# Patient Record
Sex: Male | Born: 1950 | Race: White | Marital: Married | Smoking: Never smoker
Health system: Northeastern US, Academic
[De-identification: ages and names within clinical notes are randomized; demographics above are authoritative.]

## PROBLEM LIST (undated history)

## (undated) DIAGNOSIS — M1990 Unspecified osteoarthritis, unspecified site: Secondary | ICD-10-CM

## (undated) DIAGNOSIS — E785 Hyperlipidemia, unspecified: Secondary | ICD-10-CM

## (undated) DIAGNOSIS — F419 Anxiety disorder, unspecified: Secondary | ICD-10-CM

## (undated) DIAGNOSIS — I10 Essential (primary) hypertension: Secondary | ICD-10-CM

## (undated) DIAGNOSIS — K219 Gastro-esophageal reflux disease without esophagitis: Secondary | ICD-10-CM

## (undated) HISTORY — DX: Gastro-esophageal reflux disease without esophagitis: K21.9

## (undated) HISTORY — DX: Anxiety disorder, unspecified: F41.9

## (undated) HISTORY — DX: Essential (primary) hypertension: I10

## (undated) HISTORY — PX: OTHER SURGICAL HISTORY: SHX169

## (undated) HISTORY — PX: SPINAL FUSION: SHX223

## (undated) HISTORY — DX: Hyperlipidemia, unspecified: E78.5

## (undated) HISTORY — DX: Unspecified osteoarthritis, unspecified site: M19.90

## (undated) HISTORY — PX: KNEE REPLACEMENT: SHX530B

---

## 2019-12-31 IMAGING — CT CT CHEST WITHOUT CONTRAST
2 of 4 series · 13 of 36 positions shown, 16 images · non-contrast
Comparison: Calcium scoring CT images 12/24/2019.

CT CHEST WITHOUT CONTRAST, 12/31/2019 [DATE]: 
CLINICAL INDICATION: Micronodules base of the LEFT lung. 
A search for DICOM formatted images was conducted for prior CT imaging studies 
completed at a non-affiliated media free facility.
TECHNIQUE: The chest was scanned from base of neck through the lung bases 
without contrast on a high resolution low dose CT scanner.  Routine MPR and MIP 
3D renderings were reconstructed on an independent workstation with concurrent 
physician supervision.

[Series 2: chest w/o 2.0 i31s 3 · axial · non-contrast · 0.97mm/px · z∈[-287,-15]mm · 10 of 162 slices shown, 13 images]
[im 13/162  mediastinal]
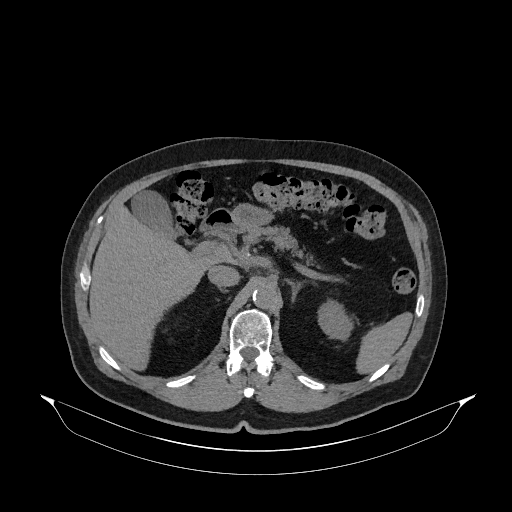
[im 13/162  lung]
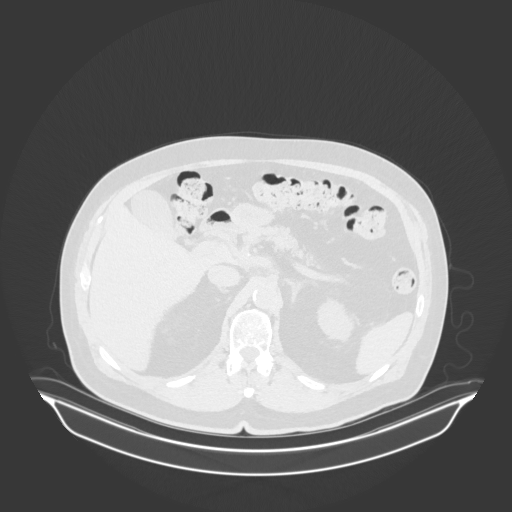
[im 25/162  lung]
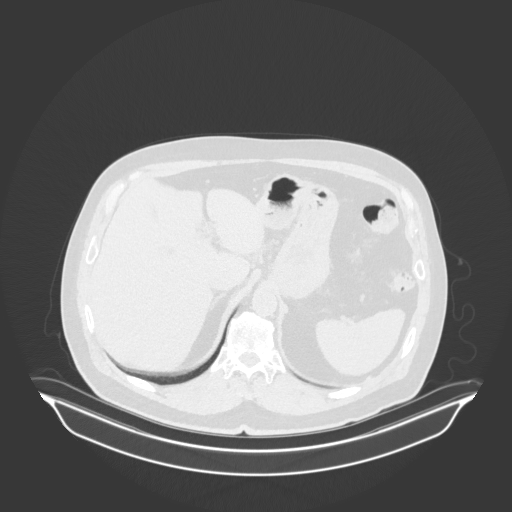
[im 50/162  lung]
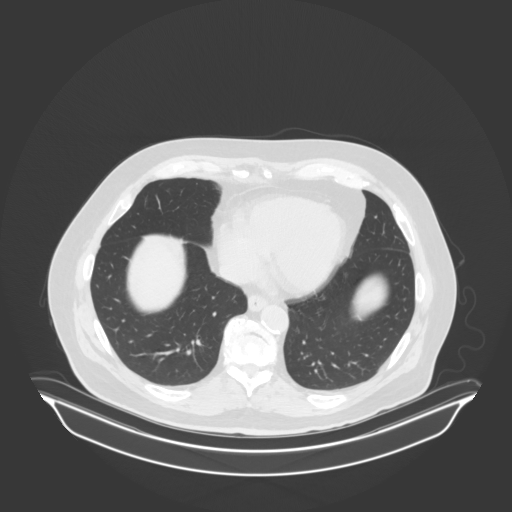
[im 62/162  lung]
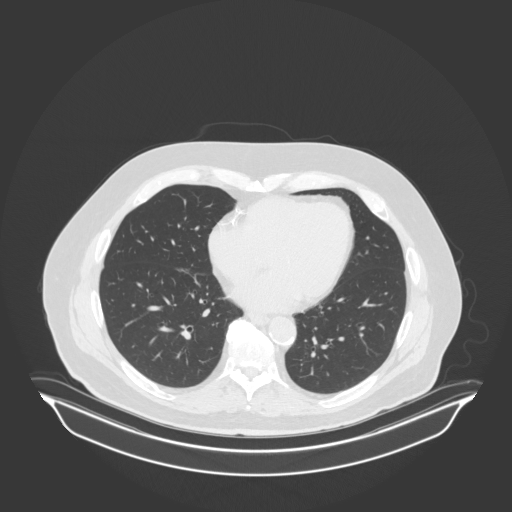
[im 75/162  mediastinal]
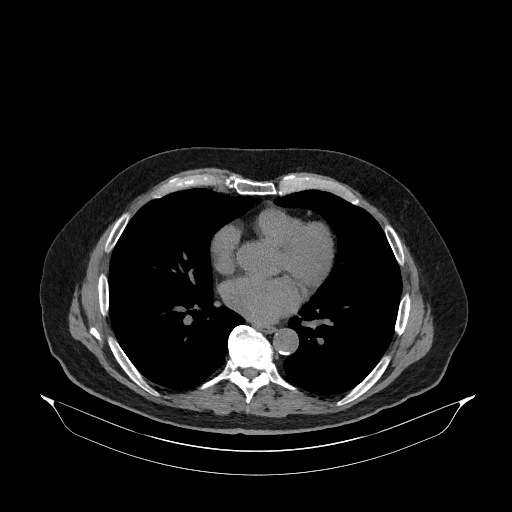
[im 75/162  lung]
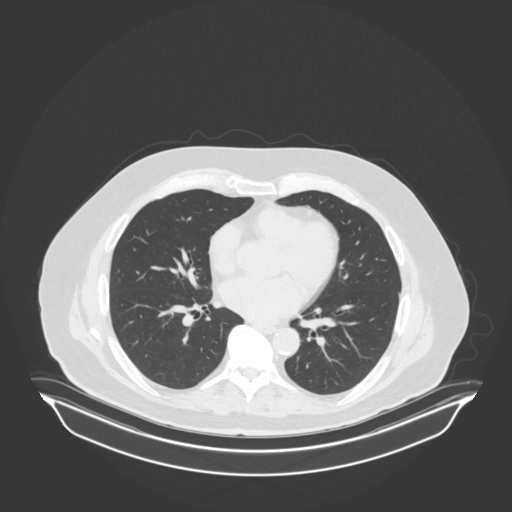
[im 87/162  lung]
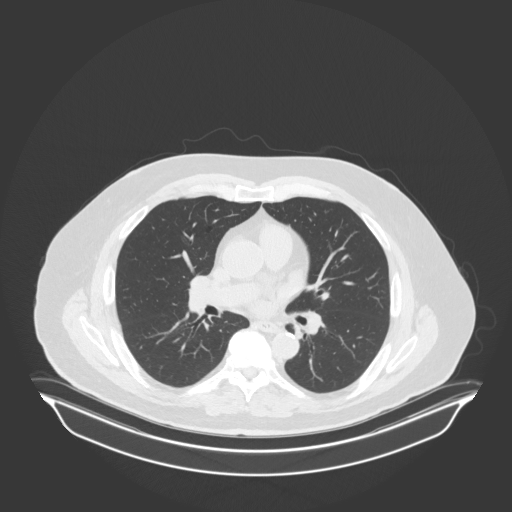
[im 100/162  lung]
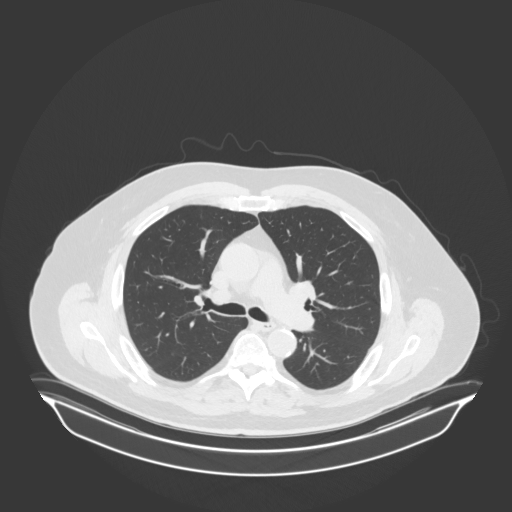
[im 124/162  lung]
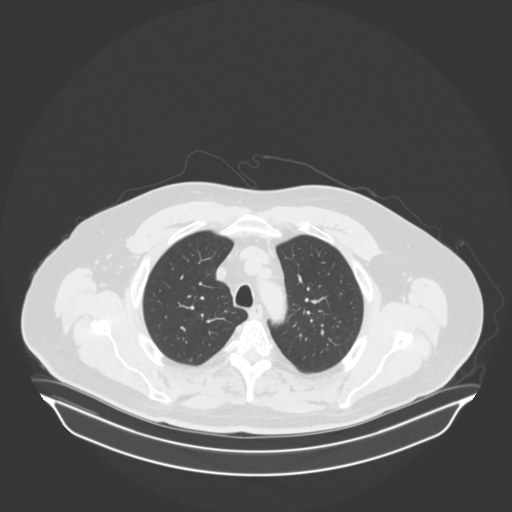
[im 137/162  mediastinal]
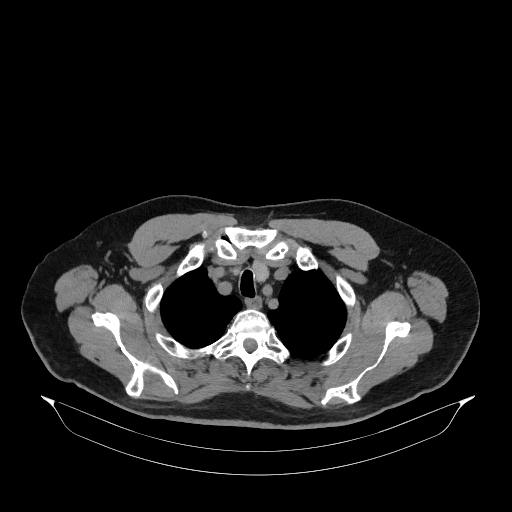
[im 137/162  lung]
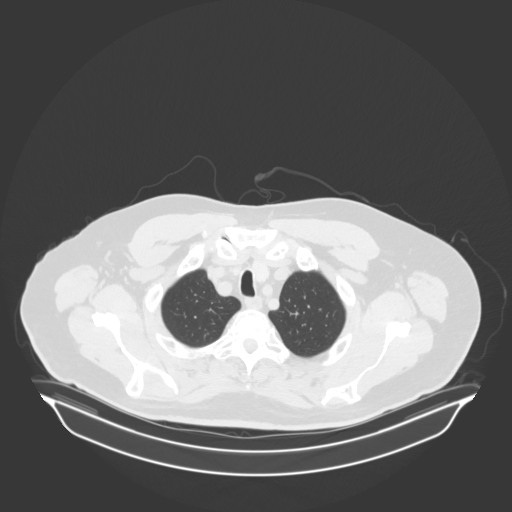
[im 149/162  lung]
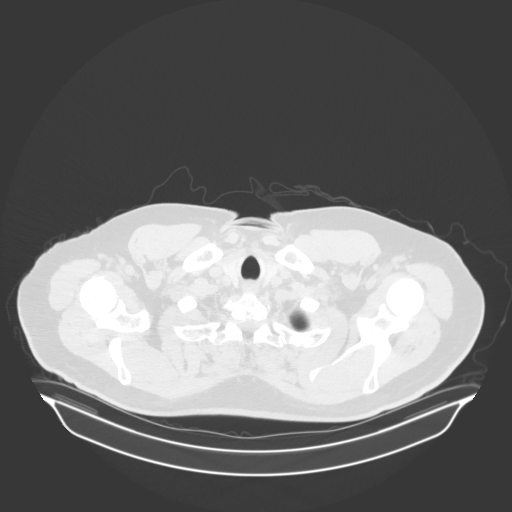

[Series 4: coronal · coronal · 0.66mm/px · 3 of 144 slices shown]
[im 29/144  lung]
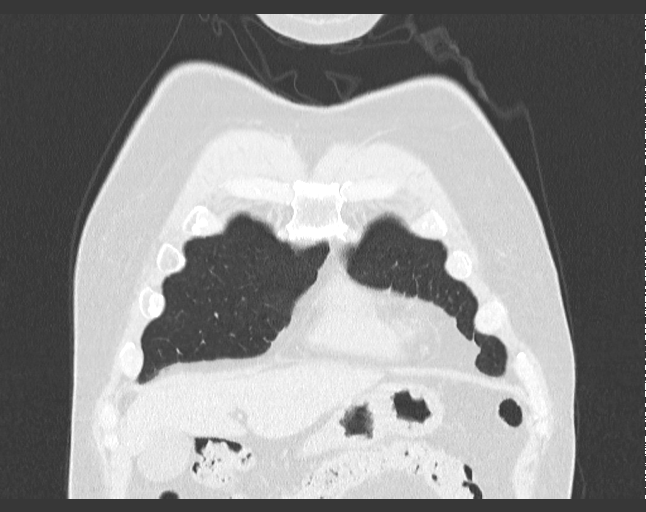
[im 58/144  lung]
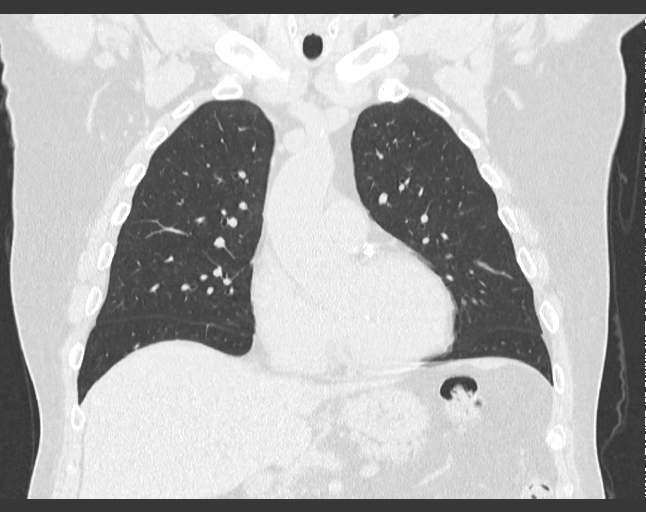
[im 86/144  lung]
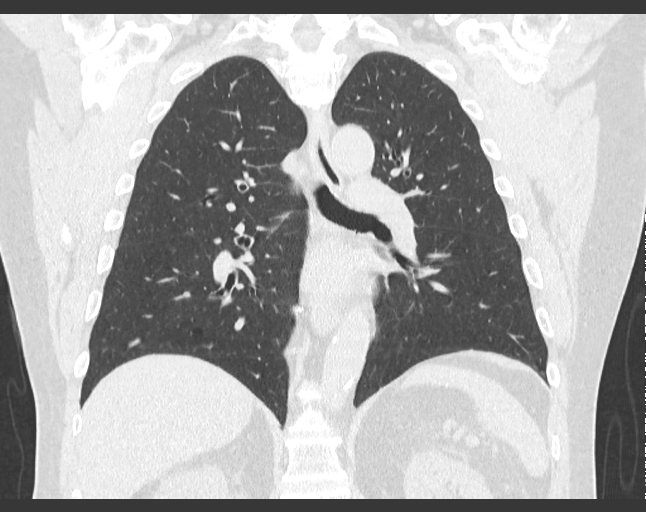

[13 of 36 positions shown; findings below may reference images not displayed]

FINDINGS: The described abnormality is an 8 mm groundglass nodule in the anterior base of 
the LEFT lower lobe which should undergo standard follow-up following Fleischner 
guidelines which are included at the end of this report. No other significant 
lung nodules or masses are found. No evidence for emphysema or interstitial lung 
disease. The airways are normal. No pleural effusion. The heart is normal in 
size. Moderate to severe coronary artery calcifications are visible. No 
significant pericardial fluid. Overall diameter of the aorta is normal. There is 
a small hiatal hernia. Esophagus is within normal limits. No enlarged 
mediastinal lymph nodes are detected. There is no axillary lymphadenopathy. 
Limited imaging of the inferior neck shows no significant incidental pathology. 
Limited imaging of the upper abdomen shows incidental small simple cyst in the 
LEFT lobe of the liver. There is a partially visible cyst in the RIGHT kidney. 
Degenerative changes are in the spine. There is a calcified disc herniation at 
T11-12 producing moderate spinal canal stenosis and possible cord compression.
IMPRESSION: 1. The LEFT lower lung abnormality in question is a groundglass nodule measuring 
approximately 8 mm which could represent a scar or a small neoplasm. Follow-up 
should be performed as per Fleischner guidelines included below. (NODULE SIZE>/= 
6 MM: 
*FOLLOW-UP CT AT 6-12 MONTHS, THEN EVERY TWO YEARS UNTIL FIVE YEARS) 
2. Calcified disc herniation at T11-12 producing moderate central canal stenosis 
and potential cord compromise. REFERENCE: Recommendations for pulmonary nodule 
follow-up according to [HOSPITAL] Guidelines. 
Reference: 
Solitary pure ground-glass nodule 
Nodule size < 6mm 
*No ct follow-up is required 
NODULE SIZE>/= 6 MM 
*FOLLOW-UP CT AT 6-12 MONTHS, THEN EVERY TWO YEARS UNTIL FIVE YEARS 
Solitary part-solid nodule 
Nodule size < 6mm 
*No ct follow-up is required 
Nodule size >/= 6mm 
*Follow-up CT at 3-6 months 
*If unchanged, and solid component remains < 6mm, then annual follow-up for 5 
years. 
Multiple nodules size: < 6mm 
*Low risk patients: no routine follow-up. 
*High risk patients: optional CT at 12 months. 
Multiple nodules size: 6-8mm 
*Low risk patients: follow-up at 3-6 months, then consider further follow-up at 
18-24 months. 
*High risk patients: follow-up at 3-6 months, then at 18-24 months if no change. 
Multiple nodules size: > 8mm 
*Low risk patients: follow-up at 3-6 months, then consider further follow-up at 
18-24 months. 
*High risk patients: follow-up at 3-6 months, then at 18-24 months if no change. 
Size is average of length and width. 
RADIATION DOSE REDUCTION: All CT scans are performed using radiation dose 
reduction techniques, when applicable.  Technical factors are evaluated and 
adjusted to ensure appropriate moderation of exposure.  Automated dose 
management technology is applied to adjust the radiation doses to minimize 
exposure while achieving diagnostic quality images.

## 2022-11-07 NOTE — Progress Notes (Signed)
Type of Form: medical records    Delivery Method: fax    Received From:[facility]    Release Obtained: yes    What to do with form after completion:give to provider to review    Where was form placed: placed in file cabinet up front as patient's appointment is not until 04/27/2023.

## 2023-02-03 ENCOUNTER — Encounter: Payer: Self-pay | Admitting: Gastroenterology

## 2023-02-08 ENCOUNTER — Encounter: Payer: Self-pay | Admitting: Gastroenterology

## 2023-03-16 ENCOUNTER — Ambulatory Visit: Payer: Medicare Other | Attending: Family Medicine | Admitting: Family Medicine

## 2023-03-16 ENCOUNTER — Encounter: Payer: Self-pay | Admitting: Family Medicine

## 2023-03-16 ENCOUNTER — Other Ambulatory Visit: Payer: Self-pay

## 2023-03-16 ENCOUNTER — Telehealth: Payer: Self-pay | Admitting: Family Medicine

## 2023-03-16 VITALS — BP 134/70 | HR 71 | Temp 97.1°F | Ht 66.0 in | Wt 220.0 lb

## 2023-03-16 DIAGNOSIS — I10 Essential (primary) hypertension: Secondary | ICD-10-CM | POA: Insufficient documentation

## 2023-03-16 DIAGNOSIS — E785 Hyperlipidemia, unspecified: Secondary | ICD-10-CM | POA: Insufficient documentation

## 2023-03-16 DIAGNOSIS — E119 Type 2 diabetes mellitus without complications: Secondary | ICD-10-CM

## 2023-03-16 DIAGNOSIS — F411 Generalized anxiety disorder: Secondary | ICD-10-CM | POA: Insufficient documentation

## 2023-03-16 NOTE — Telephone Encounter (Signed)
Spoke to patient. Stated that he just received a call from Cardiology and he is scheduled with them on May 8th. He will continue with his appointment with Dr. Darnelle Bos on May 2nd.

## 2023-03-16 NOTE — Telephone Encounter (Signed)
Noted  

## 2023-03-16 NOTE — Progress Notes (Signed)
Subjective: Joe Roman is a 72 year old male with history of hyperlipidemia, type 2 diabetes, anxiety, and hypertension who presents today for a new patient visit.  He has an appointment scheduled with Dr. Darnelle Bos 04/27/2023.  Joe Roman is originally from [place] and spends the winters in [place].  Over this winter they built a house in [place] and moved in a few weeks ago to be near his family and grandchildren.    Hyperlipidemia: Joe Roman was previously started on Lipitor a long time ago.  He had tolerated it well until age 52 then developed myalgias.  This was stopped and he was started on Crestor and developed the same side effects including myalgias of the arms and legs.  He saw cardiology who put him on another cholesterol medicine that he cannot remember the name and also had side effects by 6 months.  He then started Repatha in July and tolerated this well for the first few months.  He then developed worsening lower back, knee, and joint pain 2 days after the injection.  This would feel quite debilitating for a week then his symptoms will start to improve.  He was not experiencing side effects with the first few injections.  He has had a bilateral knee replacement and when he has increased pain in his knees after the injection this changes his gait and he is concerned he will fall due to the poor balance.  His last injection was 02/25/2023.  He is interested in different medication for his cholesterol.  He had blood work in [place] done 02/03/2023 that showed improving cholesterol panel.  He is afraid to go off the medication.    Hypertension: Joe Roman checks his blood pressure at home and it typically is around 126/68.  Denies chest pain, shortness of breath, or lower extremity edema.    Hyperglycemia: Joe Roman tells me his blood sugars have been increasing since being on the Repatha.  He was prediabetic but at the February blood draw his hemoglobin A1c was 6.6.  He has cut out ice cream, pizza, and [place] fries.  He no longer  drinks craft beers.  His doctor in [place] said he could have until November to get his numbers down and they would recheck this.  He was not started on medication for diabetes.  There is a family history of type 2 diabetes.  Joe Roman has not been able to walk much due to the ongoing joint pain.  He plans to join the [employer] in [place] and start swimming again.  His joints and lower back feels much better when he is swimming.    Anxiety: Joe Roman had been on Lexapro 10 mg daily when his mother was sick and dying.  There was a lot of family stressors at that time.  He was prescribed alprazolam to take as needed.  He takes this very sparingly.  He typically only takes it every few months if he is flying or has something stressful going on, for example the closing of his house recently.    Medication list is reviewed and updated in Epic. Medication changes are made.     Past medical history including medications and allergies reviewed in Epic, no changes.     Current Outpatient Medications on File Prior to Visit   Medication Sig Dispense Refill    ALPRAZolam (XANAX) 0.25 mg tablet Take 1 tablet (0.25 mg total) by mouth 2 times daily as needed for Anxiety  Max daily dose: 0.5 mg      evolocumab (REPATHA) 140 mg/mL prefilled  syringe Inject 1 mL (140 mg total) into the skin every 14 days      amLODIPine (NORVASC) 5 mg tablet Take 1 tablet (5 mg total) by mouth daily      metoprolol tartrate (LOPRESSOR) 25 mg tablet Take 0.5 tablets (12.5 mg total) by mouth 2 times daily      aspirin 81 mg EC tablet Take 1 tablet (81 mg total) by mouth daily      Probiotic Product (PROBIOTIC DAILY PO) Take 1 tablet by mouth daily      Multiple Vitamins-Minerals (MULTIVITAMIN ADULTS 50+ PO) Take 1 tablet by mouth daily      cyanocobalamin (VITAMIN B-12) 500 mcg tablet Take 1 tablet (500 mcg total) by mouth daily       No current facility-administered medications on file prior to visit.         There is no problem list on file for this patient.        Objective:   BP 134/70   Pulse 71   Temp 36.2 C (97.1 F) (Temporal)   Ht 1.676 m (5\' 6" )   Wt 99.8 kg (220 lb)   SpO2 98%   BMI 35.51 kg/m    General: Well developed, well nourished, no acute distress.  Cardiovascular: Regular rate and rhythm, no murmurs, no rubs, normal S1 and S2, no carotid bruits.  Respiratory: Lungs clear to auscultation, no wheezes, no rhonchi.  Extremities: 2+ pedal pulses, no edema, capillary refill under 3 seconds.    Labs done 02/03/2023 through [facility].  Cholesterol 157  Triglycerides 128  HDL 51  LDL 80  Hemoglobin A1c 6.6    Assessment/Plan:   1. Hyperlipidemia, unspecified hyperlipidemia type  AMB REFERRAL TO [facility]ARDIOLOGY - [place]      2. Hypertension, unspecified type        3. Generalized anxiety disorder        4. Type 2 diabetes mellitus           1.  Hyperlipidemia: Referral placed for cardiology for further management of the hyperlipidemia and options for newer injectable medications.  I will also touch base with Dr. Darnelle Bos to see if there are medications she prefers.  He will follow-up in the office today as planned.    2.  Hypertension: Blood pressure at goal.  Continue current medications.    3.  Generalized anxiety disorder: Continue Xanax as needed for flying.    4.  Type 2 diabetes: He will continue increasing his physical activity and reducing carbohydrates and sweets.    Addendum: I spoke with Dr. Darnelle Bos and she does not typically prescribe injectably medications for hyperlipidemia and agrees with cardiology consult. I will notify Joe Roman.     Also see patient instructions.    After visit summary is printed and given to patient.    This note was dictated wholly or in part with Dragon voice recognition software.

## 2023-03-16 NOTE — Telephone Encounter (Signed)
Please let [name] know I spoke with Dr. Darnelle Bos and she does not typically prescribe injectable medications for hyperlipidemia.  She agrees with a cardiology consult for further management.

## 2023-03-16 NOTE — Progress Notes (Signed)
Release of Medical Records faxed to Dr.[facility] at [phone].    Fax-confirmed

## 2023-04-27 ENCOUNTER — Other Ambulatory Visit: Payer: Self-pay

## 2023-04-27 ENCOUNTER — Encounter: Payer: Self-pay | Admitting: Family Medicine

## 2023-04-27 ENCOUNTER — Ambulatory Visit: Payer: Medicare Other | Attending: Family Medicine | Admitting: Family Medicine

## 2023-04-27 VITALS — BP 130/60 | HR 63 | Temp 97.1°F | Ht 66.0 in | Wt 213.3 lb

## 2023-04-27 DIAGNOSIS — F40243 Fear of flying: Secondary | ICD-10-CM | POA: Insufficient documentation

## 2023-04-27 DIAGNOSIS — G8929 Other chronic pain: Secondary | ICD-10-CM

## 2023-04-27 DIAGNOSIS — M545 Low back pain, unspecified: Secondary | ICD-10-CM | POA: Insufficient documentation

## 2023-04-27 DIAGNOSIS — I10 Essential (primary) hypertension: Secondary | ICD-10-CM | POA: Insufficient documentation

## 2023-04-27 MED ORDER — AMLODIPINE BESYLATE 10 MG PO TABS *I*
10.0000 mg | ORAL_TABLET | Freq: Every day | ORAL | 1 refills | Status: DC
Start: 2023-04-27 — End: 2023-10-30

## 2023-04-27 NOTE — Progress Notes (Signed)
NAMEBobbyjoe Roman MRN: [id number]    DOB: [birth year] AGE: 72 y.o. SEX: male     Chief Complaint: Chief Complaint   Patient presents with    New Patient Visit    Back Pain     D/x'd with spinal stenosis, ruptured disc, has had steroid injection with minimal relief.        Visit History for  04/27/2023    Data   History of Present Illness    Np here to establish care, was seen by Fleet Contras before    Was seen by other PCP in [place], last seen in February. 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  Reluctant to start any new meds  Has an appt with Pain doc in the next few month  He is managing fine at this time.  He exercises a lot, can't do 3 miles anymore  Swims a lot, joined the [employer] [facility] cardio and goes 2-3x a week  Also does Yoga 2x a week and pilates a few times a week.    Hypertension  Currently on amlodipine 10mg   No compliance concerns. Tolerating medications well, denies medication side effects  Denies chest pain, SOB, blurry vision, LE edema, headaches.   Follows low salt diet.  Attempts to follow heart healthy diet and regular exercise.   Chonic condition    Anxiety: associated with flying and takes periodically for flying     No results found for: "LDLC", "TSH", "GLU", "CA", "NA", "K", "CREAT", "AST"    No results found for: "HCT"     BP Readings from Last 3 Encounters:   04/27/23 130/60   03/16/23 134/70       Body mass index is 34.43 kg/m.    Wt Readings from Last 3 Encounters:   04/27/23 96.8 kg (213 lb 4.8 oz)   03/16/23 99.8 kg (220 lb)      Review of Systems   As hpi        Tobacco  History:  Patient  reports that he has never smoked. He has never been exposed to tobacco smoke. He has never used  smokeless tobacco.               Family History   Problem Relation Age of Onset    High Blood Pressure Mother     Arthritis Mother     Anxiety disorder Mother     High Blood Pressure Father     Heart Disease Father     Diabetes Father     Cancer Father         color    Heart attack Father     High cholesterol Brother     Stroke Brother     High cholesterol Brother     High Blood Pressure Maternal Grandmother     Diabetes Maternal Grandmother     Stroke Maternal Grandfather     High Blood Pressure Maternal Grandfather      Social History     Tobacco Use    Smoking status: Never     Passive exposure: Never    Smokeless tobacco: Never   Vaping  Use    Vaping status: Never Used   Substance Use Topics    Alcohol use: Yes     Comment: 1-2 per week    Drug use: Never       Medications and Allergies     Current Outpatient Medications on File Prior to Visit   Medication Sig Dispense Refill    magnesium oxide (MAG-OX) 400 (241.3 mg) mg tablet Take 0.5 tablets (200 mg total) by mouth daily.      metoprolol tartrate (LOPRESSOR) 25 mg tablet Take 0.5 tablets (12.5 mg total) by mouth 2 times daily.      aspirin 81 mg EC tablet Take 1 tablet (81 mg total) by mouth daily.      Probiotic Product (PROBIOTIC DAILY PO) Take 1 tablet by mouth daily      Multiple Vitamins-Minerals (MULTIVITAMIN ADULTS 50+ PO) Take 1 tablet by mouth daily      cyanocobalamin (VITAMIN B-12) 500 mcg tablet Take 1 tablet (500 mcg total) by mouth daily.      ALPRAZolam (XANAX) 0.25 mg tablet Take 1 tablet (0.25 mg total) by mouth 2 times daily as needed for Anxiety.       No current facility-administered medications on file prior to visit.     No Known Allergies (drug, envir, food or latex)    I have reviewed the above medications and allergies         Objective:     Today's vitals:  height is 1.676 m (5\' 6" ) and weight is 96.8 kg (213 lb 4.8 oz). His temporal temperature is 36.2 C (97.1 F). His blood pressure is 130/60 and his pulse is 63. His oxygen  saturation is 98%.      Physical Exam  Constitutional:       Appearance: Normal appearance. He is obese.   Pulmonary:      Effort: Pulmonary effort is normal.   Neurological:      Mental Status: He is alert. Mental status is at baseline.   Psychiatric:         Mood and Affect: Mood normal.         Behavior: Behavior normal.         Thought Content: Thought content normal.         Judgment: Judgment normal.               Assessment/Plan       [name] was seen today for new patient visit and back pain.    Diagnoses and all orders for this visit:    Hypertension, unspecified type  Well controlled at this time, cont with amlodipine  Cont with exercise and good diet and weight loss  -     amLODIPine (NORVASC) 10 mg tablet; Take 1 tablet (10 mg total) by mouth daily for High Blood Pressure Disorder.  -     Comprehensive metabolic panel; Future  -     Lipid Panel (Reflex to Direct  LDL if Triglycerides more than 400); Future    Chronic bilateral low back pain without sciatica  Stable with exercise, sees pain management in [place] as well    Anxiety with flying  Alprazolam PRN with flying                           Follow up  recommended: Follow up in about 6 months (around 10/28/2023) for Hypertension.     Patient verbalized understanding of the above instructions.  Dr.Angelik Walls MD.  [place]

## 2023-04-28 ENCOUNTER — Telehealth: Payer: Self-pay | Admitting: Family Medicine

## 2023-04-28 ENCOUNTER — Other Ambulatory Visit
Admission: RE | Admit: 2023-04-28 | Discharge: 2023-04-28 | Disposition: A | Discharge status: Home / Self Care | Payer: Medicare Other | Source: Ambulatory Visit | Attending: Family Medicine | Admitting: Family Medicine

## 2023-04-28 DIAGNOSIS — I10 Essential (primary) hypertension: Secondary | ICD-10-CM

## 2023-04-28 LAB — COMPREHENSIVE METABOLIC PANEL
ALT: 27 U/L (ref 0–50)
AST: 26 U/L (ref 0–50)
Albumin: 4.1 g/dL (ref 3.5–5.2)
Alk Phos: 70 U/L (ref 40–130)
Anion Gap: 11 (ref 7–16)
Bilirubin,Total: 0.6 mg/dL (ref 0.0–1.2)
CO2: 24 mmol/L (ref 20–28)
Calcium: 9.6 mg/dL (ref 8.6–10.2)
Chloride: 106 mmol/L (ref 96–108)
Creatinine: 0.93 mg/dL (ref 0.67–1.17)
Glucose: 129 mg/dL — ABNORMAL HIGH (ref 60–99)
Lab: 18 mg/dL (ref 6–20)
Potassium: 4.7 mmol/L (ref 3.3–5.1)
Sodium: 141 mmol/L (ref 133–145)
Total Protein: 6.5 g/dL (ref 6.3–7.7)
eGFR BY CREAT: 87 *

## 2023-04-28 LAB — LIPID PANEL
Chol/HDL Ratio: 4.7
Cholesterol: 230 mg/dL — AB
HDL: 49 mg/dL (ref 40–60)
LDL Calculated: 156 mg/dL — AB
Non HDL Cholesterol: 181 mg/dL
Triglycerides: 127 mg/dL

## 2023-04-28 NOTE — Telephone Encounter (Addendum)
LM with information below and asked that call back.    ----- Message from Nurse Jomarie Longs D sent at 04/28/2023  1:22 PM EDT -----  Please let patient know labs are ok. Cholesterol is high and his risk is high for athlerosclerotis cardiovascular disease. He would need to be placed on a statin (cholesterol lowing med) to help bring bad cholesterol levels down and lower risk. Let me know if he is interested and I will send him something.

## 2023-05-03 ENCOUNTER — Other Ambulatory Visit: Payer: Self-pay

## 2023-05-03 ENCOUNTER — Encounter: Payer: Self-pay | Admitting: Cardiology

## 2023-05-03 ENCOUNTER — Ambulatory Visit: Payer: Medicare Other | Attending: Cardiology | Admitting: Cardiology

## 2023-05-03 VITALS — BP 140/58 | HR 63 | Ht 66.0 in | Wt 216.6 lb

## 2023-05-03 DIAGNOSIS — I213 ST elevation (STEMI) myocardial infarction of unspecified site: Secondary | ICD-10-CM

## 2023-05-03 DIAGNOSIS — Z789 Other specified health status: Secondary | ICD-10-CM

## 2023-05-03 DIAGNOSIS — I440 Atrioventricular block, first degree: Secondary | ICD-10-CM

## 2023-05-03 DIAGNOSIS — R002 Palpitations: Secondary | ICD-10-CM | POA: Insufficient documentation

## 2023-05-04 ENCOUNTER — Encounter: Payer: Self-pay | Admitting: Cardiology

## 2023-05-04 NOTE — Progress Notes (Signed)
[facility]        Cardiology Office Consult Note    Date of Consult: 05/03/2023 Patient: Joe Roman   Patients PCP: Erlene Senters, MD Patient DOB: [birth year]  EMRN: [id number]     History of Present Illness/Reason For Visit     I had the pleasure of seeing Joe Roman in cardiology consult on 05/03/2023. Joe Roman is an 72 y.o. male who we were asked to see for hyperlipidemia.    Joe Roman in [place]; followed there with previous cardiac testing, not yet available; has hyperlipidemia and has been on multiple statins, Zetia, Repatha; in each case has had initial good lab value response but after several months muscle and joint pain which resolves with going off the Rx.    Today appears comfortable.    Past Medical and Surgical History     Past Medical History:   Diagnosis Date   . Anxiety    . GERD (gastroesophageal reflux disease)    . Hyperlipemia    . Hypertension      Past Surgical History:   Procedure Laterality Date   . KNEE REPLACEMENT Bilateral    . left hip replacement Left        Medications and Allergies     Current Outpatient Medications   Medication Sig   . magnesium oxide (MAG-OX) 400 (241.3 mg) mg tablet Take 0.5 tablets (200 mg total) by mouth daily.   Marland Kitchen amLODIPine (NORVASC) 10 mg tablet Take 1 tablet (10 mg total) by mouth daily for High Blood Pressure Disorder.   . metoprolol tartrate (LOPRESSOR) 25 mg tablet Take 0.5 tablets (12.5 mg total) by mouth 2 times daily.   Marland Kitchen aspirin 81 mg EC tablet Take 1 tablet (81 mg total) by mouth daily.   . Probiotic Product (PROBIOTIC DAILY PO) Take 1 tablet by mouth daily   . Multiple Vitamins-Minerals (MULTIVITAMIN ADULTS 50+ PO) Take 1 tablet by mouth daily   . cyanocobalamin (VITAMIN B-12) 500 mcg tablet Take 1 tablet (500 mcg total) by mouth daily.   Marland Kitchen ALPRAZolam (XANAX) 0.25 mg tablet Take 1 tablet (0.25 mg total) by mouth 2 times daily as needed for Anxiety.     He has No Known Allergies (drug, envir, food or latex).    Social and Family History      Family History   Problem Relation Age of Onset   . High Blood Pressure Mother    . Arthritis Mother    . Anxiety disorder Mother    . High Blood Pressure Father    . Heart Disease Father    . Diabetes Father    . Cancer Father         color   . Heart attack Father    . High cholesterol Brother    . Stroke Brother    . High cholesterol Brother    . High Blood Pressure Maternal Grandmother    . Diabetes Maternal Grandmother    . Stroke Maternal Grandfather    . High Blood Pressure Maternal Grandfather      Social History     Socioeconomic History   . Marital status: Married   Tobacco Use   . Smoking status: Never     Passive exposure: Never   . Smokeless tobacco: Never   Vaping Use   . Vaping status: Never Used   Substance and Sexual Activity   . Alcohol use: Yes     Comment: 1-2 per week   . Drug use: Never   .  Sexual activity: Yes     Partners: Female     Birth control/protection: None         Review of Systems     ROS  Vitals and Physical Exam     Gurnie's  height is 1.676 m (5\' 6" ) and weight is 98.2 kg (216 lb 9.6 oz). His blood pressure is 140/58 and his pulse is 63. His oxygen saturation is 96%.  Body mass index is 34.96 kg/m.    Physical Exam  Constitutional:       General: He is not in acute distress.     Appearance: He is well-developed.   HENT:      Head: Normocephalic and atraumatic.   Neck:      Vascular: No JVD.   Cardiovascular:      Rate and Rhythm: Normal rate and regular rhythm.      Heart sounds: Normal heart sounds. No murmur heard.     No friction rub. No gallop.   Pulmonary:      Effort: Pulmonary effort is normal. No respiratory distress.      Breath sounds: Normal breath sounds. No wheezing or rales.   Skin:     General: Skin is warm.   Neurological:      Mental Status: He is alert.       Laboratory Data     Hematology:   No results found for requested labs within last 730 days.     Chemistry:   Results in Past 730 Days  Result Component Current Result Previous Result   Sodium 141 (04/28/2023)  Not in Time Range   Potassium 4.7 (04/28/2023) Not in Time Range   Creatinine 0.93 (04/28/2023) Not in Time Range   Glucose 129 (H) (04/28/2023) Not in Time Range   Calcium 9.6 (04/28/2023) Not in Time Range   AST 26 (04/28/2023) Not in Time Range   ALT 27 (04/28/2023) Not in Time Range     Coagulation Studies:   No results found for requested labs within last 730 days.     Cardiac:   No results found for requested labs within last 730 days.     Lipids:   Results in Past 730 Days  Result Component Current Result Previous Result   Cholesterol 230 (!) (04/28/2023) Not in Time Range   HDL 49 (04/28/2023) Not in Time Range   Triglycerides 127 (04/28/2023) Not in Time Range   LDL Calculated 156 (!) (04/28/2023) Not in Time Range   Chol/HDL Ratio 4.7 (04/28/2023) Not in Time Range       Cardiac/Imaging Data & Risk Scores     ECG: NSR, 1st degree AV block, LAD                       Impression and Plan     Patient Active Problem List   Diagnosis Code   . Chronic bilateral low back pain without sciatica M54.50, G89.29   . Hypertension, unspecified type I10   . Anxiety with flying F40.243       This is an 72 y.o. male with hyperlipidemia and medication intolerance; will obtain old records and review; will consult with specialty pharmacy re approval of Praluent or bempedoic acid.    See back in followup. Plan explained and accepted.          Geralyn Corwin, MD  Electronically signed on 05/04/2023 at 7:37 PM.

## 2023-05-05 ENCOUNTER — Other Ambulatory Visit: Payer: Self-pay | Admitting: Cardiology

## 2023-05-05 ENCOUNTER — Other Ambulatory Visit: Payer: Self-pay

## 2023-05-05 ENCOUNTER — Other Ambulatory Visit: Payer: Self-pay | Admitting: Pharmacist

## 2023-05-05 DIAGNOSIS — E785 Hyperlipidemia, unspecified: Secondary | ICD-10-CM

## 2023-05-05 MED ORDER — ALIROCUMAB 75 MG/ML SC SOAJ *I*
SUBCUTANEOUS | 5 refills | Status: DC
Start: 2023-05-05 — End: 2023-06-23
  Filled 2023-05-05: qty 2, 28d supply, fill #0
  Filled 2023-05-26: qty 6, 84d supply, fill #1
  Filled 2023-05-29: qty 2, 28d supply, fill #1

## 2023-05-05 NOTE — Progress Notes (Signed)
Received new Praluent prescription for Joe Roman.  Prior authorization is required.     Medication name, strength, instructions: Praluent 75 mg, 1 mL subcutaneously every 14 days  Quantity/days supply: 2 mL / 28 days  Prescriber: Fredric Mare    New medication or reauthorization: New  Diagnosis: ASCVD    Previous medications tried and outcome: statin/ezetimibe/Repatha intolerance    Clinical documentation:   Office note dated 05/03/23  Lipid panel dated 04/28/23  Outside records dated 02/08/23 (media tab)    Insurance plan: Maryjean Ka: [id number]  PCN: [id number]  ID: [id number]      Thank you,    Cindra Presume, PharmD, BCPS  [facility]  Phone: [phone]  Fax: [phone]

## 2023-05-08 ENCOUNTER — Other Ambulatory Visit: Payer: Self-pay

## 2023-05-08 NOTE — Progress Notes (Signed)
Prior authorization for Praluent for Joe Roman was approved through 12/26/23.  Called and left voicemail for patient.    Thank you,    Cindra Presume, PharmD, BCPS  [facility]  Phone: [phone]  Fax: [phone]

## 2023-05-09 ENCOUNTER — Other Ambulatory Visit: Payer: Self-pay

## 2023-05-09 NOTE — Addendum Note (Signed)
Addended by: Cindra Presume on: 05/09/2023 04:14 PM     Modules accepted: Orders

## 2023-05-09 NOTE — Progress Notes (Signed)
Spoke over the phone with Marlene Bast regarding medication teaching for Praluent for High CV risk, ASCVD on imaging.  Prior authorization was approved through 12/26/23 .     Initial assessment included review of interdisciplinary team documentation on physical health, social, environmental, economic, functional and mental components relevant to the patient's ability to adhere to the care plan with [facility] [employer].    Baseline lipid panel reviewed:      Lab results: 04/28/23  0828   Cholesterol 230*   Triglycerides 127   HDL 49   LDL Calculated 156*   Non HDL Cholesterol 181   Chol/HDL Ratio 4.7     Current antihyperlipidemic treatment: none (statin/ezetimibe/Repatha intolerance)    Administration, treatment goals, duration of therapy, adherence strategies, side effects, safety precautions, contraindications, missed dose management, handling, storage and disposal were reviewed with the patient.     Administration instructions: Praluent 75 mg pen into the skin every 14 days    First injection: Planned for 05/11/23. Offered phone teach appointment, but patient elects to start independently.    Adherence plan: Calendar    Lab schedule: Lipid panel ~8 weeks after medication start to assess efficacy then every 6-12 months and as needed thereafter    Specialty Pharmacy: [facility]; Delivery Date: 05/11/23; Copay: $50 (Does not qualify for [employer] grant)    Patient declined full medication reconciliation but states MyChart list is up to date. No significant interactions identified.    Current Outpatient Medications   Medication Sig    alirocumab (PRALUENT) 75 MG/ML pen injector Inject 1 pen (75 mg) into the skin every 14 days    magnesium oxide (MAG-OX) 400 (241.3 mg) mg tablet Take 0.5 tablets (200 mg total) by mouth daily.    amLODIPine (NORVASC) 10 mg tablet Take 1 tablet (10 mg total) by mouth daily for High Blood Pressure Disorder.    ALPRAZolam (XANAX) 0.25 mg tablet Take 1 tablet (0.25 mg  total) by mouth 2 times daily as needed for Anxiety.    metoprolol tartrate (LOPRESSOR) 25 mg tablet Take 0.5 tablets (12.5 mg total) by mouth 2 times daily.    aspirin 81 mg EC tablet Take 1 tablet (81 mg total) by mouth daily.    Probiotic Product (PROBIOTIC DAILY PO) Take 1 tablet by mouth daily    Multiple Vitamins-Minerals (MULTIVITAMIN ADULTS 50+ PO) Take 1 tablet by mouth daily    cyanocobalamin (VITAMIN B-12) 500 mcg tablet Take 1 tablet (500 mcg total) by mouth daily.       Plan to follow up with patient during first refill in 3 weeks. Encouraged him to call with any questions or concerns. Thank you for allowing me to participate in the care of this patient. Please contact me with any questions.    Cindra Presume, PharmD, BCPS  [facility]  Phone: [phone]  Fax: [phone]

## 2023-05-10 ENCOUNTER — Other Ambulatory Visit: Payer: Self-pay

## 2023-05-11 LAB — EKG 12-LEAD
P: 41 deg
PR: 214 ms
QRS: -47 deg
QRSD: 88 ms
QT: 403 ms
QTc: 412 ms
Rate: 63 {beats}/min
T: -53 deg

## 2023-05-16 NOTE — Progress Notes (Signed)
Medical Records received and placed in Mds bin.

## 2023-05-16 NOTE — Progress Notes (Signed)
Type of Form: medical records    Delivery Method: fax    Received From:[facility]    Release Obtained:yes    What to do with form after completion: give to provider to review    Where was form placed:placed in Marisol's bin

## 2023-05-25 ENCOUNTER — Other Ambulatory Visit: Payer: Self-pay

## 2023-05-26 ENCOUNTER — Other Ambulatory Visit: Payer: Self-pay

## 2023-05-29 ENCOUNTER — Other Ambulatory Visit: Payer: Self-pay

## 2023-05-29 ENCOUNTER — Other Ambulatory Visit: Payer: Self-pay | Admitting: Pharmacist

## 2023-06-23 ENCOUNTER — Other Ambulatory Visit: Payer: Self-pay

## 2023-06-23 ENCOUNTER — Other Ambulatory Visit: Payer: Self-pay | Admitting: Cardiology

## 2023-06-23 DIAGNOSIS — E785 Hyperlipidemia, unspecified: Secondary | ICD-10-CM

## 2023-06-23 MED ORDER — PRALUENT 75 MG/ML SC SOAJ
SUBCUTANEOUS | 3 refills | Status: DC
Start: 2023-06-23 — End: 2024-04-17
  Filled 2023-06-23: qty 6, 84d supply, fill #0
  Filled 2023-09-20: qty 6, 84d supply, fill #1
  Filled 2023-10-03: qty 2, 28d supply, fill #1
  Filled 2023-10-30: qty 2, 28d supply, fill #2
  Filled 2023-11-27 – 2023-12-08 (×2): qty 2, 28d supply, fill #3
  Filled 2023-12-29: qty 6, 84d supply, fill #4
  Filled 2024-03-20: qty 2, 28d supply, fill #5
  Filled 2024-04-17: qty 2, 28d supply, fill #6

## 2023-06-26 ENCOUNTER — Other Ambulatory Visit: Payer: Self-pay

## 2023-06-27 ENCOUNTER — Other Ambulatory Visit: Payer: Self-pay

## 2023-06-28 ENCOUNTER — Other Ambulatory Visit: Payer: Self-pay

## 2023-07-13 ENCOUNTER — Other Ambulatory Visit
Admission: RE | Admit: 2023-07-13 | Discharge: 2023-07-13 | Disposition: A | Discharge status: Home / Self Care | Payer: Medicare Other | Source: Ambulatory Visit | Attending: Cardiology | Admitting: Cardiology

## 2023-07-13 DIAGNOSIS — E785 Hyperlipidemia, unspecified: Secondary | ICD-10-CM | POA: Insufficient documentation

## 2023-07-13 LAB — LIPID PANEL
Chol/HDL Ratio: 3.4
Cholesterol: 178 mg/dL
HDL: 52 mg/dL (ref 40–60)
LDL Calculated: 92 mg/dL
Non HDL Cholesterol: 126 mg/dL
Triglycerides: 168 mg/dL — AB

## 2023-07-19 ENCOUNTER — Other Ambulatory Visit: Payer: Self-pay

## 2023-07-19 ENCOUNTER — Encounter: Payer: Self-pay | Admitting: Cardiology

## 2023-07-19 ENCOUNTER — Ambulatory Visit: Payer: Medicare Other | Attending: Cardiology | Admitting: Cardiology

## 2023-07-19 VITALS — BP 123/60 | HR 60 | Ht 66.0 in | Wt 217.2 lb

## 2023-07-19 DIAGNOSIS — E785 Hyperlipidemia, unspecified: Secondary | ICD-10-CM | POA: Insufficient documentation

## 2023-07-20 ENCOUNTER — Encounter: Payer: Self-pay | Admitting: Cardiology

## 2023-07-20 NOTE — Progress Notes (Signed)
[facility]        Cardiology Office Revisit Note    Date of Visit: 07/19/2023 Patient: Joe Roman   Patients PCP: Erlene Senters, MD Patient DOB: Jun 07, 1951  EMRN: [id number]     Subjective/Reason For Visit     I had the pleasure of seeing Joe Roman in cardiology followup on 07/19/2023. Feels ok since seen; tolerates Praluent and lipid profile much improved.    Feels well.    Tolerates Rx.     Past Medical History:   Diagnosis Date    Anxiety     GERD (gastroesophageal reflux disease)     Hyperlipemia     Hypertension      Past Surgical History:   Procedure Laterality Date    KNEE REPLACEMENT Bilateral     left hip replacement Left      ROS  Medications     Current Outpatient Medications   Medication Sig    alirocumab (PRALUENT) 75 MG/ML pen injector Inject 1 pen (75 mg) into the skin every 14 days    magnesium oxide (MAG-OX) 400 (241.3 mg) mg tablet Take 0.5 tablets (200 mg total) by mouth daily.    amLODIPine (NORVASC) 10 mg tablet Take 1 tablet (10 mg total) by mouth daily for High Blood Pressure Disorder.    ALPRAZolam (XANAX) 0.25 mg tablet Take 1 tablet (0.25 mg total) by mouth 2 times daily as needed for Anxiety.    metoprolol tartrate (LOPRESSOR) 25 mg tablet Take 0.5 tablets (12.5 mg total) by mouth 2 times daily.    aspirin 81 mg EC tablet Take 1 tablet (81 mg total) by mouth daily.    Probiotic Product (PROBIOTIC DAILY PO) Take 1 tablet by mouth daily    Multiple Vitamins-Minerals (MULTIVITAMIN ADULTS 50+ PO) Take 1 tablet by mouth daily    cyanocobalamin (VITAMIN B-12) 500 mcg tablet Take 1 tablet (500 mcg total) by mouth daily.     Vitals and Physical Exam     [name]'s  height is 1.676 m (5\' 6" ) and weight is 98.5 kg (217 lb 3.2 oz). His blood pressure is 123/60 and his pulse is 60. His oxygen saturation is 96%.  Body mass index is 35.06 kg/m.    Physical Exam  Laboratory Data     Hematology:   No results found for requested labs within last 730 days.     Chemistry:   Results in  Past 730 Days  Result Component Current Result Previous Result   Sodium 141 (04/28/2023) Not in Time Range   Potassium 4.7 (04/28/2023) Not in Time Range   Creatinine 0.93 (04/28/2023) Not in Time Range   Glucose 129 (H) (04/28/2023) Not in Time Range   Calcium 9.6 (04/28/2023) Not in Time Range   AST 26 (04/28/2023) Not in Time Range   ALT 27 (04/28/2023) Not in Time Range     Coagulation Studies:   No results found for requested labs within last 730 days.     Cardiac:   No results found for requested labs within last 730 days.     Lipids:   Results in Past 730 Days  Result Component Current Result Previous Result   Cholesterol 178 (07/13/2023) 230 (!) (04/28/2023)   HDL 52 (07/13/2023) 49 (04/28/2023)   Triglycerides 168 (!) (07/13/2023) 127 (04/28/2023)   LDL Calculated 92 (07/13/2023) 156 (!) (04/28/2023)   Chol/HDL Ratio 3.4 (07/13/2023) 4.7 (04/28/2023)     Cardiac/Imaging Data & Risk Scores     ECG:  Impression and Plan     Patient Active Problem List   Diagnosis Code    Chronic bilateral low back pain without sciatica M54.50, G89.29    Hypertension, unspecified type I10    Anxiety with flying F40.243       This is an 72 y.o. male with hyperlipidemia is doing ok with current approach; see back in one year. Plan explained and accepted.          Geralyn Corwin, MD  Electronically signed on 07/20/2023 at 3:51 PM.

## 2023-08-07 ENCOUNTER — Telehealth: Payer: Self-pay | Admitting: Family Medicine

## 2023-08-07 DIAGNOSIS — Z86010 Personal history of colonic polyps: Secondary | ICD-10-CM

## 2023-08-07 DIAGNOSIS — Z1211 Encounter for screening for malignant neoplasm of colon: Secondary | ICD-10-CM

## 2023-08-07 NOTE — Telephone Encounter (Signed)
Referral Request    Speciality:colonoscopy  Name of specialist:n/a  Phone number and fax of specialist:n/a  Reason for referral:states had colonoscopy done in [place]  3 years ago and had some polyps removed and was advised to repeat colonoscopy in 3 years

## 2023-08-08 NOTE — Telephone Encounter (Signed)
Referral to GI placed

## 2023-08-08 NOTE — Telephone Encounter (Signed)
Patient has been notified and verbalized understanding 

## 2023-09-14 ENCOUNTER — Other Ambulatory Visit
Admission: RE | Admit: 2023-09-14 | Discharge: 2023-09-14 | Disposition: A | Discharge status: Home / Self Care | Payer: Medicare Other | Source: Ambulatory Visit | Attending: Cardiology | Admitting: Cardiology

## 2023-09-14 DIAGNOSIS — E785 Hyperlipidemia, unspecified: Secondary | ICD-10-CM | POA: Insufficient documentation

## 2023-09-14 LAB — LIPID PANEL
Chol/HDL Ratio: 3.1
Cholesterol: 166 mg/dL
HDL: 53 mg/dL (ref 40–60)
LDL Calculated: 91 mg/dL
Non HDL Cholesterol: 113 mg/dL
Triglycerides: 110 mg/dL

## 2023-09-14 LAB — HEPATIC FUNCTION PANEL
ALT: 40 U/L (ref 0–50)
AST: 25 U/L (ref 0–50)
Albumin: 4.1 g/dL (ref 3.5–5.2)
Alk Phos: 84 U/L (ref 40–130)
Bilirubin,Direct: <0.2 mg/dL (ref 0.0–0.3)
Bilirubin,Total: 0.5 mg/dL (ref 0.0–1.2)
Total Protein: 6.8 g/dL (ref 6.3–7.7)

## 2023-09-14 LAB — TSH: TSH: 2.55 u[IU]/mL (ref 0.27–4.20)

## 2023-09-14 LAB — HEMOGLOBIN A1C: Hemoglobin A1C: 6.5 % — ABNORMAL HIGH

## 2023-09-20 ENCOUNTER — Other Ambulatory Visit: Payer: Self-pay

## 2023-09-20 ENCOUNTER — Encounter: Payer: Self-pay | Admitting: Pharmacist

## 2023-09-22 ENCOUNTER — Other Ambulatory Visit: Payer: Self-pay

## 2023-09-28 ENCOUNTER — Other Ambulatory Visit: Payer: Self-pay | Admitting: Pharmacist

## 2023-09-28 ENCOUNTER — Other Ambulatory Visit: Payer: Self-pay

## 2023-10-03 ENCOUNTER — Other Ambulatory Visit: Payer: Self-pay | Admitting: Pharmacist

## 2023-10-03 ENCOUNTER — Other Ambulatory Visit: Payer: Self-pay

## 2023-10-11 ENCOUNTER — Other Ambulatory Visit: Payer: Self-pay

## 2023-10-11 ENCOUNTER — Ambulatory Visit: Payer: Medicare Other | Attending: Family Medicine | Admitting: Family Medicine

## 2023-10-11 VITALS — BP 140/70 | HR 65 | Temp 97.1°F | Ht 66.0 in | Wt 219.0 lb

## 2023-10-11 DIAGNOSIS — I10 Essential (primary) hypertension: Secondary | ICD-10-CM | POA: Insufficient documentation

## 2023-10-11 DIAGNOSIS — F411 Generalized anxiety disorder: Secondary | ICD-10-CM | POA: Insufficient documentation

## 2023-10-11 DIAGNOSIS — E782 Mixed hyperlipidemia: Secondary | ICD-10-CM | POA: Insufficient documentation

## 2023-10-11 DIAGNOSIS — E119 Type 2 diabetes mellitus without complications: Secondary | ICD-10-CM | POA: Insufficient documentation

## 2023-10-11 DIAGNOSIS — F99 Mental disorder, not otherwise specified: Secondary | ICD-10-CM | POA: Insufficient documentation

## 2023-10-11 DIAGNOSIS — F5105 Insomnia due to other mental disorder: Secondary | ICD-10-CM | POA: Insufficient documentation

## 2023-10-11 MED ORDER — HYDROXYZINE HCL 25 MG PO TABS *I*
25.0000 mg | ORAL_TABLET | Freq: Every evening | ORAL | 1 refills | Status: DC
Start: 2023-10-11 — End: 2023-12-12
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
## 2023-10-17 ENCOUNTER — Encounter: Payer: Self-pay | Admitting: Family Medicine

## 2023-10-17 MED ORDER — TRAZODONE HCL 50 MG PO TABS *I*
50.0000 mg | ORAL_TABLET | Freq: Every evening | ORAL | 1 refills | Status: DC
Start: 2023-10-17 — End: 2024-06-12

## 2023-10-17 NOTE — Telephone Encounter (Signed)
L/m for patient to call back

## 2023-10-17 NOTE — Telephone Encounter (Signed)
Didn't do well with hydroxyzine will trial alternative for sleep and anxiety vs remaining on benzos.  Trazodone sent for patient low dose to try, please keep follow up.

## 2023-10-17 NOTE — Telephone Encounter (Signed)
Patient has been given the below message.

## 2023-10-28 ENCOUNTER — Other Ambulatory Visit: Payer: Self-pay | Admitting: Family Medicine

## 2023-10-28 DIAGNOSIS — I10 Essential (primary) hypertension: Secondary | ICD-10-CM

## 2023-10-30 ENCOUNTER — Other Ambulatory Visit: Payer: Self-pay

## 2023-10-30 ENCOUNTER — Other Ambulatory Visit: Payer: Self-pay | Admitting: Pharmacist

## 2023-10-30 NOTE — Telephone Encounter (Signed)
Last office visit:   10/11/2023  Patients upcoming appointments:  Future Appointments   Date Time Provider Department Center   12/12/2023 10:00 AM Erlene Senters, MD [facility] None     Recent Lab results:  GENERAL CHEMISTRY   Recent Labs     04/28/23  0828   NA 141   K 4.7   CL 106   CO2 24   GAP 11   UN 18   CREAT 0.93   GLU 129*   CA 9.6      LIPID PROFILE   Recent Labs     09/14/23  0808 07/13/23  0807 04/28/23  0828   CHOL 166 178 230*   TRIG 110 168* 127   HDL 53 52 49   LDLC 91 92 578*      LIVER PROFILE   Recent Labs     09/14/23  0808 04/28/23  0828   ALT 40 27   AST 25 26   ALK 84 70   TB 0.5 0.6      DIABETES THYROID   Recent Labs     09/14/23  0808   HA1C 6.5*    Recent Labs     09/14/23  0808   TSH 2.55         Pending/Orders Labs:  Lab Frequency Next Occurrence   Hemoglobin A1c Once 10/11/2023

## 2023-11-07 ENCOUNTER — Other Ambulatory Visit: Payer: Self-pay

## 2023-11-13 ENCOUNTER — Other Ambulatory Visit: Payer: Self-pay

## 2023-11-27 ENCOUNTER — Encounter: Payer: Self-pay | Admitting: Pharmacist

## 2023-11-27 ENCOUNTER — Other Ambulatory Visit: Payer: Self-pay

## 2023-11-29 ENCOUNTER — Other Ambulatory Visit: Payer: Self-pay

## 2023-12-01 ENCOUNTER — Other Ambulatory Visit: Payer: Self-pay

## 2023-12-04 ENCOUNTER — Other Ambulatory Visit: Payer: Self-pay | Admitting: Pharmacist

## 2023-12-04 ENCOUNTER — Other Ambulatory Visit: Payer: Self-pay

## 2023-12-08 ENCOUNTER — Other Ambulatory Visit: Payer: Self-pay

## 2023-12-11 ENCOUNTER — Other Ambulatory Visit: Payer: Self-pay

## 2023-12-12 ENCOUNTER — Ambulatory Visit: Payer: Medicare Other | Attending: Family Medicine | Admitting: Family Medicine

## 2023-12-12 ENCOUNTER — Other Ambulatory Visit: Payer: Self-pay

## 2023-12-12 ENCOUNTER — Encounter: Payer: Self-pay | Admitting: Family Medicine

## 2023-12-12 ENCOUNTER — Other Ambulatory Visit: Payer: Self-pay | Admitting: Family Medicine

## 2023-12-12 VITALS — BP 130/77 | HR 60 | Temp 97.1°F | Ht 66.0 in | Wt 219.0 lb

## 2023-12-12 DIAGNOSIS — I10 Essential (primary) hypertension: Secondary | ICD-10-CM | POA: Insufficient documentation

## 2023-12-12 DIAGNOSIS — Z0000 Encounter for general adult medical examination without abnormal findings: Secondary | ICD-10-CM | POA: Insufficient documentation

## 2023-12-12 DIAGNOSIS — R0981 Nasal congestion: Secondary | ICD-10-CM | POA: Insufficient documentation

## 2023-12-12 DIAGNOSIS — F5105 Insomnia due to other mental disorder: Secondary | ICD-10-CM | POA: Insufficient documentation

## 2023-12-12 DIAGNOSIS — Z114 Encounter for screening for human immunodeficiency virus [HIV]: Secondary | ICD-10-CM | POA: Insufficient documentation

## 2023-12-12 DIAGNOSIS — Z1159 Encounter for screening for other viral diseases: Secondary | ICD-10-CM | POA: Insufficient documentation

## 2023-12-12 DIAGNOSIS — E119 Type 2 diabetes mellitus without complications: Secondary | ICD-10-CM | POA: Insufficient documentation

## 2023-12-12 DIAGNOSIS — F411 Generalized anxiety disorder: Secondary | ICD-10-CM | POA: Insufficient documentation

## 2023-12-12 DIAGNOSIS — F99 Mental disorder, not otherwise specified: Secondary | ICD-10-CM | POA: Insufficient documentation

## 2023-12-12 MED ORDER — SEMAGLUTIDE (0.25 OR 0.5MG/DOSE) 2 MG/3ML SC SOPN *I*
PEN_INJECTOR | SUBCUTANEOUS | 0 refills | Status: DC
Start: 2023-12-12 — End: 2024-02-06

## 2023-12-12 MED ORDER — MOMETASONE FUROATE 50 MCG/ACT NA SUSP *I*
2.0000 | Freq: Every day | NASAL | 1 refills | Status: AC
Start: 2023-12-12 — End: ?

## 2023-12-12 MED ORDER — SEMAGLUTIDE (0.25 OR 0.5MG/DOSE) 2 MG/3ML SC SOPN *I*
PEN_INJECTOR | SUBCUTANEOUS | 0 refills | Status: DC
Start: 2023-12-12 — End: 2023-12-12

## 2023-12-12 NOTE — Patient Instructions (Signed)
Thank you for completing your Follow-up and Initial Annual Medicare visit   with [place] today.     The purpose of this visits was to:    Screen for disease  Assess risk of future medical problems  Help develop a healthy lifestyle  Update vaccines  Get to know your doctor in case of an illness    Patient Care Team:  Erlene Senters, MD as PCP - General (Family Medicine)  Cindra Presume, PharmD (Pharmacist)  Geralyn Corwin, MD as Consulting Provider (Cardiology)     Medicare 5 Year Plan    The following items were identified as areas of concern during your screening today:  Diabetes - This is a risk factor for Heart Attack, Stroke, Kidney Problems, Eye Problems and a loss of feeling in your fingers and feet.   High Blood Pressure (Hypertension) - This is a risk factor for Heart Attack, Stroke, Kidney Problems and Eye Problems.   BMI greater than 25 - This is a risk for Heart Attack, Stroke, High Blood Pressure, Diabetes, High Cholesterol and other complications.       The Health Maintenance table below identifies screening tests and immunizations recommended by your health care team:  Health Maintenance: These screening recommendations are based on USPSTF, [employer], and [place] state guidelines   Topic Date Due   . HIV Screening  Never done   . Diabetic Foot Exam  Never done   . Diabetic Kidney Disease Urine  Never done   . Diabetic Eye Exam  Never done   . Hepatitis C Screening  Never done   . Colon Cancer Screening  Never done   . Diabetic A1C Monitoring  03/13/2024   . Diabetic Kidney Disease Blood  04/27/2024   . Depression - Yearly  12/11/2024   . Fall Risk Screening  12/11/2024   . DTaP/Tdap/Td Vaccines (4 - Td or Tdap) 08/20/2031   . Flu Shot  Completed   . Pneumococcal Vaccination  Completed   . COVID-19 Vaccine  Completed   . Hepatitis B Vaccine  Aged Out   . HIB Vaccine  Aged Out   . HPV Vaccine  Aged Out   . Meningococcal Vaccine  Aged Out   . Rotavirus Vaccine  Aged Out   . Shingles Vaccine  Discontinued      In addition, goals and orders placed to address these recommendations are listed in the "Today's Visit" section.    We wish you the best of health and look forward to seeing you again next year for your Annual Medicare Wellness Visit.     If you have any health care concerns before then, please do not hesitate to contact us.

## 2023-12-12 NOTE — Telephone Encounter (Signed)
error 

## 2023-12-12 NOTE — Progress Notes (Signed)
NAMEHerber Roman MRN: [id number]    DOB: December 04, 1951 AGE: 72 y.o. SEX: male     Chief Complaint: Chief Complaint   Patient presents with    Follow-up    Initial Annual Medicare visit        Visit History for  12/12/2023    Data   History of Present Illness    Hypertension  Currently on amlodipine 10mg , metoprolol 12.5mg  BID  No compliance concerns. Tolerating medications well, denies medication side effects  Denies chest pain, SOB, blurry vision, LE edema, headaches.   Follows low salt diet.  Attempts to follow heart healthy diet and regular exercise.   Chonic condition     DM: most recent A1c is 6.5, the repeat in [place] came back at 6.7  He is urinating more and thirsty a lot as well  Diabetes runs in his family  Not currently on metformin.    GAD: was started on hydroxyzine and didn't do well, was started on trazodone instead. The trazodone gave him bad diarrhea, tries it for 2 weeks and stopped t  Unisom works really well for sleep  Anxiety is much better, family issues have resolved and no longer has anxiety from this  Has some anxiety with travel, and went to [place] and took xanax on the plan.  Xanax from [facility] physician    EKG in May: follows with cardio  IMPRESSION:   Sinus rhythm  Borderline prolonged PR interval  Inferior infarct, old       Patient has been having sinus issues  No fevers or chills  No facial pain or pressure  No ear pain  No jaw pain or headache  Has been using generic post nasal drip pill and it dries.  Right now not feeling too congested  But as the day goes on and when he wakes up he feels all filled up   Lab Results   Component Value Date    LDLC 91 09/14/2023    TSH 2.55 09/14/2023    GLU 129 (H) 04/28/2023    CA 9.6 04/28/2023    NA 141 04/28/2023    K 4.7 04/28/2023    CREAT 0.93 04/28/2023    AST 25 09/14/2023       No results found for: "HCT"     BP Readings from Last 3 Encounters:   12/12/23 130/77   10/11/23 140/70   07/19/23 123/60       Body mass index is 35.35  kg/m.    Wt Readings from Last 3 Encounters:   12/12/23 99.3 kg (219 lb)   10/11/23 99.3 kg (219 lb)   07/19/23 98.5 kg (217 lb 3.2 oz)      Review of Systems   As hpi        Tobacco  History:  Patient  reports that he has never smoked. He has never been exposed to tobacco smoke. He has never used smokeless tobacco.               Family History   Problem Relation Age of Onset    High Blood Pressure Mother     Arthritis Mother     Anxiety disorder Mother     High Blood Pressure Father     Heart Disease Father     Diabetes Father     Cancer Father         color    Heart attack Father     High cholesterol Brother     Stroke  Brother     High cholesterol Brother     High Blood Pressure Maternal Grandmother     Diabetes Maternal Grandmother     Stroke Maternal Grandfather     High Blood Pressure Maternal Grandfather      Social History     Tobacco Use    Smoking status: Never     Passive exposure: Never    Smokeless tobacco: Never   Vaping Use    Vaping status: Never Used   Substance Use Topics    Alcohol use: Yes     Comment: 1-2 per week    Drug use: Never       Medications and Allergies     Current Outpatient Medications on File Prior to Visit   Medication Sig Dispense Refill    amLODIPine (NORVASC) 10 mg tablet TAKE 1 TABLET (10 MG TOTAL) BY MOUTH DAILY FOR HIGH BLOOD PRESSURE DISORDER. 90 tablet 3    alirocumab (PRALUENT) 75 MG/ML pen injector Inject 1 pen (75 mg) into the skin every 14 days 6 mL 3    magnesium oxide (MAG-OX) 400 (241.3 mg) mg tablet Take 0.5 tablets (200 mg total) by mouth daily.      metoprolol tartrate (LOPRESSOR) 25 mg tablet Take 0.5 tablets (12.5 mg total) by mouth 2 times daily.      aspirin 81 mg EC tablet Take 1 tablet (81 mg total) by mouth daily.      Probiotic Product (PROBIOTIC DAILY PO) Take 1 tablet by mouth daily      Multiple Vitamins-Minerals (MULTIVITAMIN ADULTS 50+ PO) Take 1 tablet by mouth daily      cyanocobalamin (VITAMIN B-12) 500 mcg tablet Take 1 tablet (500 mcg total) by  mouth daily.      Doxylamine Succinate, Sleep, (UNISOM PO) Take by mouth.      traZODone (DESYREL) 50 mg tablet Take 1 tablet (50 mg total) by mouth nightly for Anxiety Disorder, Trouble Sleeping. 30 tablet 1    ALPRAZolam (XANAX) 0.25 mg tablet Take 1 tablet (0.25 mg total) by mouth 2 times daily as needed for Anxiety.       No current facility-administered medications on file prior to visit.     No Known Allergies (drug, envir, food or latex)    I have reviewed the above medications and allergies         Objective:     Today's vitals:  height is 1.676 m (5\' 6" ) and weight is 99.3 kg (219 lb). His temperature is 36.2 C (97.1 F). His blood pressure is 130/77 and his pulse is 60. His oxygen saturation is 98%.      Physical Exam  Vitals and nursing note reviewed.   Constitutional:       Appearance: Normal appearance. He is normal weight.   HENT:      Head: Normocephalic and atraumatic.      Nose: Nose normal.      Mouth/Throat:      Mouth: Mucous membranes are moist.      Pharynx: Oropharynx is clear.   Eyes:      Extraocular Movements: Extraocular movements intact.      Conjunctiva/sclera: Conjunctivae normal.      Pupils: Pupils are equal, round, and reactive to light.   Cardiovascular:      Rate and Rhythm: Normal rate and regular rhythm.      Pulses: Normal pulses.      Heart sounds: Normal heart sounds.   Pulmonary:  Effort: Pulmonary effort is normal.      Breath sounds: Normal breath sounds.   Musculoskeletal:         General: Normal range of motion.      Cervical back: Normal range of motion and neck supple.   Skin:     General: Skin is warm.      Capillary Refill: Capillary refill takes less than 2 seconds.   Neurological:      General: No focal deficit present.      Mental Status: He is alert and oriented to person, place, and time. Mental status is at baseline.   Psychiatric:         Mood and Affect: Mood normal.         Behavior: Behavior normal.         Thought Content: Thought content normal.          Judgment: Judgment normal.               Assessment/Plan       Joe Roman was seen today for follow-up and initial annual medicare visit.    Diagnoses and all orders for this visit:    Preventative health care  -     Cancel: EKG 12 lead - (During Visit)    GAD (generalized anxiety disorder)  Stable at this time  Xanax prn from [place] PCP    Insomnia due to other mental disorder  Doig well with unisome for sleep    Type 2 diabetes mellitus without complication, without long-term current use of insulin  Discussed metformin vs ozempic with patient today  He does not have risk for pancreatitis or MEN 2, we discussed SE in detail of both options and decision to start ozempic was made  Patient will FU with [facility]. He will let me know if not tolerated after first shot and we can switch to meformin if needed  -     semaglutide, 0.25 or 0.5 mg/dose, (OZEMPIC) pen; Inject 0.25 mg into the skin once a week for 28 days, THEN 0.5 mg once a week for 28 days.  -     Comprehensive metabolic panel; Future  -     Hemoglobin A1c; Future  -     Microalbumin, Urine, Random; Future    Hypertension, unspecified type  Well controlled    Encounter for screening for HIV  -     HIV-1/2  Antigen/Antibody Screen with Confirmation; Future    Encounter for hepatitis C screening test for low risk patient  -     Hepatitis C Virus Antibody With Reflex To Hepatitis C Quantitative; Future    Nasal congestion  Not infectious this time, some chronic congestion only, will trial nasal spray.  -     mometasone (NASONEX) 50 MCG/ACT nasal spray; Spray 2 sprays into each nostril daily for Inflammation of the Sinuses and the Nose.                           Follow up  recommended: Follow up in about 6 months (around 06/11/2024) for Reassessment.     Patient verbalized understanding of the above instructions.     Dr.Saliyah Gillin MD.  [facility]       Visit performed as:            Office Visit, met with patient in person    Today we reviewed  and updated Azarion Ensminger's smoking status, activities of daily  living, depression screen, fall risk, medications and allergies.   I have counseled the patient in the above areas.     Subjective:     Chief Complaint: Teryn Warzecha is a 72 y.o. male here for a/an Follow-up and Initial Annual Medicare visit    In general, Karma Beeck rates their overall health as:  excellent      Patient Care Team:  Erlene Senters, MD as PCP - General (Family Medicine)  Cindra Presume, PharmD (Pharmacist)  Geralyn Corwin, MD as Consulting Provider (Cardiology)     Current Outpatient Medications on File Prior to Visit   Medication Sig Dispense Refill    amLODIPine (NORVASC) 10 mg tablet TAKE 1 TABLET (10 MG TOTAL) BY MOUTH DAILY FOR HIGH BLOOD PRESSURE DISORDER. 90 tablet 3    alirocumab (PRALUENT) 75 MG/ML pen injector Inject 1 pen (75 mg) into the skin every 14 days 6 mL 3    magnesium oxide (MAG-OX) 400 (241.3 mg) mg tablet Take 0.5 tablets (200 mg total) by mouth daily.      metoprolol tartrate (LOPRESSOR) 25 mg tablet Take 0.5 tablets (12.5 mg total) by mouth 2 times daily.      aspirin 81 mg EC tablet Take 1 tablet (81 mg total) by mouth daily.      Probiotic Product (PROBIOTIC DAILY PO) Take 1 tablet by mouth daily      Multiple Vitamins-Minerals (MULTIVITAMIN ADULTS 50+ PO) Take 1 tablet by mouth daily      cyanocobalamin (VITAMIN B-12) 500 mcg tablet Take 1 tablet (500 mcg total) by mouth daily.      Doxylamine Succinate, Sleep, (UNISOM PO) Take by mouth.      traZODone (DESYREL) 50 mg tablet Take 1 tablet (50 mg total) by mouth nightly for Anxiety Disorder, Trouble Sleeping. 30 tablet 1    ALPRAZolam (XANAX) 0.25 mg tablet Take 1 tablet (0.25 mg total) by mouth 2 times daily as needed for Anxiety.       No current facility-administered medications on file prior to visit.     No Known Allergies (drug, envir, food or latex)  Patient Active Problem List    Diagnosis Date Noted    Type 2 diabetes mellitus without  complications 10/11/2023    Hyperlipidemia, mixed 10/11/2023    GAD (generalized anxiety disorder) 10/11/2023    Insomnia due to other mental disorder 10/11/2023    Chronic bilateral low back pain without sciatica 04/27/2023    Hypertension, unspecified type 04/27/2023    Anxiety with flying 04/27/2023     Past Medical History:   Diagnosis Date    Anxiety     GERD (gastroesophageal reflux disease)     Hyperlipemia     Hypertension      Past Surgical History:   Procedure Laterality Date    KNEE REPLACEMENT Bilateral     left hip replacement Left      Family History   Problem Relation Age of Onset    High Blood Pressure Mother     Arthritis Mother     Anxiety disorder Mother     High Blood Pressure Father     Heart Disease Father     Diabetes Father     Cancer Father         color    Heart attack Father     High cholesterol Brother     Stroke Brother     High cholesterol Brother     High Blood Pressure Maternal Grandmother  Diabetes Maternal Grandmother     Stroke Maternal Grandfather     High Blood Pressure Maternal Grandfather      Social History     Socioeconomic History    Marital status: Married   Tobacco Use    Smoking status: Never     Passive exposure: Never    Smokeless tobacco: Never   Vaping Use    Vaping status: Never Used   Substance and Sexual Activity    Alcohol use: Yes     Comment: 1-2 per week    Drug use: Never    Sexual activity: Yes     Partners: Female     Birth control/protection: None       Objective:     Vital Signs: BP 130/77   Pulse 60   Temp 36.2 C (97.1 F)   Ht 1.676 m (5\' 6" )   Wt 99.3 kg (219 lb)   SpO2 98%   BMI 35.35 kg/m    BMI: Body mass index is 35.35 kg/m.    Vision Screening Results (Welcome visit only):  No results found.    Depression Screening Results:  Review Flowsheet          12/12/2023 04/27/2023 03/16/2023   PHQ Scores   PHQ Calculated Score 0 0 0      Details                 No questionnaires on file.   Opioid Use/DAST- 10 Screening Results:   How many times in  the past year have you used an illegal drug or used a prescription medication for nonmedical reasons?: 0 (12/12/2023  9:54 AM)    Activities of Daily Living/Functional Screening Results:  Is the person deaf or does he/she have serious difficulty hearing?: N (12/12/2023  9:58 AM)  Is this person blind or does he/she have serious difficulty seeing even when wearing glasses?: N (12/12/2023  9:58 AM)  *Vision Status: Visual aid  (12/12/2023  9:58 AM)  Does this person have serious difficulty walking or climbing stairs?: N (12/12/2023  9:58 AM)  Does this person have difficulty dressing or bathing?: N (12/12/2023  9:58 AM)  *Shopping: Independent (12/12/2023  9:58 AM)  *House Keeping: Independent (12/12/2023  9:58 AM)  *Managing Own Medications: Independent (12/12/2023  9:58 AM)  *Handling Finances: Independent (12/12/2023  9:58 AM)  Difficulty doing errands due to a physicial, mental or emotional condition: No (12/12/2023  9:58 AM)  Difficulty remembering or making decisions due to a physicial, mental or emotional condition: No (12/12/2023  9:58 AM)      Fall Risk Screening Results:  Have you fallen in the last year?: No (12/12/2023  9:54 AM)      Assessment and Plan:     Cognitive Function:  Recall of recent and remote events appears:  Normal      Advanced Care Planning:  was discussed and patient received paperwork to review     The following health maintenance plan was reviewed with the patient:    Health Maintenance Topics with due status: Overdue       Topic Date Due    HIV Screening USPSTF/[place] Never done    Diabetic Foot Exam ADA Never done    Diabetic Nephropathy Screening - Urine Never done    Diabetic Eye Exam ADA Never done    Hepatitis C Screening USPSTF/[place] Never done    Colon Cancer Screening USPSTF Never done     Health Maintenance Topics with due status: Not  Due       Topic Last Completion Date    IMM DTaP/Tdap/Td 08/19/2021    Diabetic Nephropathy Screening - Blood 04/28/2023    Diabetic A1C Monitoring  ADA 09/14/2023    Depression Screen Yearly 12/12/2023    Fall Risk Screening 12/12/2023     Health Maintenance Topics with due status: Completed       Topic Last Completion Date    IMM Pneumo: 50+ Years 10/05/2021    IMM-Influenza 08/23/2023    COVID-19 Vaccine 08/23/2023     Health Maintenance Topics with due status: Aged [employer] Date Due    IMM-Hepatitis B Vaccine Aged Out    IMM-HIB 0-5 Yrs or At-Risk Patients Aged Out    IMM-HPV 9-26 Yrs or Shared Decision (27-45 Yrs) Aged Out    IMM-MCV4 0-18 Yrs or At-Risk Patients Aged Out    IMM-Rotavirus 0-8 Months Aged Out     Health Maintenance Topics with due status: Discontinued       Topic Date Due    IMM-Zoster Discontinued     This health maintenance schedule, identified risks, a list of orders placed today and patient goals have been provided to [employer] in the after visit summary.     Plan for any concerns identified during screening or risk assessments:  Up to date at this time.

## 2023-12-12 NOTE — Telephone Encounter (Signed)
Last office visit:   12/12/2023  Patients upcoming appointments:  Future Appointments   Date Time Provider Department Center   06/11/2024 10:20 AM Denyce Robert, NP [facility] None     Recent Lab results:  GENERAL CHEMISTRY   Recent Labs     04/28/23  0828   NA 141   K 4.7   CL 106   CO2 24   GAP 11   UN 18   CREAT 0.93   GLU 129*   CA 9.6      LIPID PROFILE   Recent Labs     09/14/23  0808 07/13/23  0807 04/28/23  0828   CHOL 166 178 230*   TRIG 110 168* 127   HDL 53 52 49   LDLC 91 92 161*      LIVER PROFILE   Recent Labs     09/14/23  0808 04/28/23  0828   ALT 40 27   AST 25 26   ALK 84 70   TB 0.5 0.6      DIABETES THYROID   Recent Labs     09/14/23  0808   HA1C 6.5*    Recent Labs     09/14/23  0808   TSH 2.55         Pending/Orders Labs:  Lab Frequency Next Occurrence   Comprehensive metabolic panel Once 12/12/2023   Hemoglobin A1c Once 12/12/2023   Microalbumin, Urine, Random Once 12/12/2023   Hepatitis C Virus Antibody With Reflex To Hepatitis C Quantitative Once 12/12/2023   HIV-1/2  Antigen/Antibody Screen with Confirmation Once 12/12/2023

## 2023-12-13 ENCOUNTER — Other Ambulatory Visit: Payer: Self-pay

## 2023-12-21 ENCOUNTER — Other Ambulatory Visit: Payer: Self-pay

## 2023-12-21 ENCOUNTER — Other Ambulatory Visit: Payer: Self-pay | Admitting: Pharmacist

## 2023-12-21 NOTE — Progress Notes (Signed)
Medication name, strength, instructions: Praluent 75 mg, 1 mL subcutaneously every 14 days  Quantity/days supply: 2 mL / 28 days  Prescriber: Fredric Mare    New medication or reauthorization: Reauthorization  Diagnosis: High CV risk, ASCVD on imaging    Previous medications tried and outcome: statin/ezetimibe/Repatha intolerance    Clinical documentation:   Office note dated 07/19/23  Lipid panel dated 07/13/23

## 2023-12-29 ENCOUNTER — Other Ambulatory Visit: Payer: Self-pay

## 2024-01-01 ENCOUNTER — Other Ambulatory Visit: Payer: Self-pay

## 2024-01-02 ENCOUNTER — Other Ambulatory Visit: Payer: Self-pay

## 2024-01-09 ENCOUNTER — Telehealth: Payer: Self-pay

## 2024-01-09 NOTE — Telephone Encounter (Signed)
 Hello Shanda Bumps,    This patient is scheduled for a Colonoscopy on 05/21/24 with Dr Renaldo Reel and will need Golytely bowel prep supplies sent to his pharmacy.    I verified his pharmacy as [facility] [phone] - [place], [place] - [address] [address] AT [place] [address]     Thank you so much,    Thayer Ohm

## 2024-02-06 ENCOUNTER — Telehealth: Payer: Self-pay

## 2024-02-06 LAB — HM COLONOSCOPY

## 2024-02-06 NOTE — Telephone Encounter (Signed)
Copied from CRM #[id number]. Topic: Appointments - Cancel Appointment  >> Feb 06, 2024 10:52 AM [name] wrote:  Joe Roman, patient,  is calling to cancel the patients  COD appointment which is currently scheduled on 5/27 with Flo Shanks.    Reason for the cancellation: Patient completed COD in [place]    Has the appointment been cancelled? yes    Has the appointment been rescheduled? no    Does the patient need a call back to reschedule?no    Patient can be reached at [phone]

## 2024-02-13 ENCOUNTER — Other Ambulatory Visit: Payer: Self-pay

## 2024-02-14 ENCOUNTER — Telehealth: Payer: Self-pay | Admitting: Family Medicine

## 2024-02-14 DIAGNOSIS — E785 Hyperlipidemia, unspecified: Secondary | ICD-10-CM

## 2024-02-14 NOTE — Telephone Encounter (Signed)
Writer spoke with patient and would like to get a lipid panel completed along with the other labs in place prior to upcoming visit on 06/12/24 with Fleet Contras Daily.

## 2024-02-19 NOTE — Telephone Encounter (Signed)
Writer relayed message to patient and he verbalized understanding.

## 2024-02-19 NOTE — Telephone Encounter (Signed)
 Please let him know I ordered the lipid panel to get done with the other lab work in June.

## 2024-03-20 ENCOUNTER — Other Ambulatory Visit: Payer: Self-pay

## 2024-03-20 ENCOUNTER — Encounter: Payer: Self-pay | Admitting: Pharmacist

## 2024-03-20 ENCOUNTER — Other Ambulatory Visit: Payer: Self-pay | Admitting: Pharmacist

## 2024-03-20 DIAGNOSIS — E785 Hyperlipidemia, unspecified: Secondary | ICD-10-CM

## 2024-03-20 IMAGING — DX LUMBAR SPINE AP, LAT WITH FLEXION AND EXTEN
1 series · 4 of 4 positions shown · non-contrast
Comparison: None

________________________________________________________________________________________________ 
LUMBAR SPINE AP, LAT WITH FLEXION AND EXTEN, 03/20/2024 [DATE]: 
CLINICAL INDICATION: Chronic low back pain.

[Series 1: AP · 0.14mm/px · 4 of 4 slices shown]
[im 1/4]
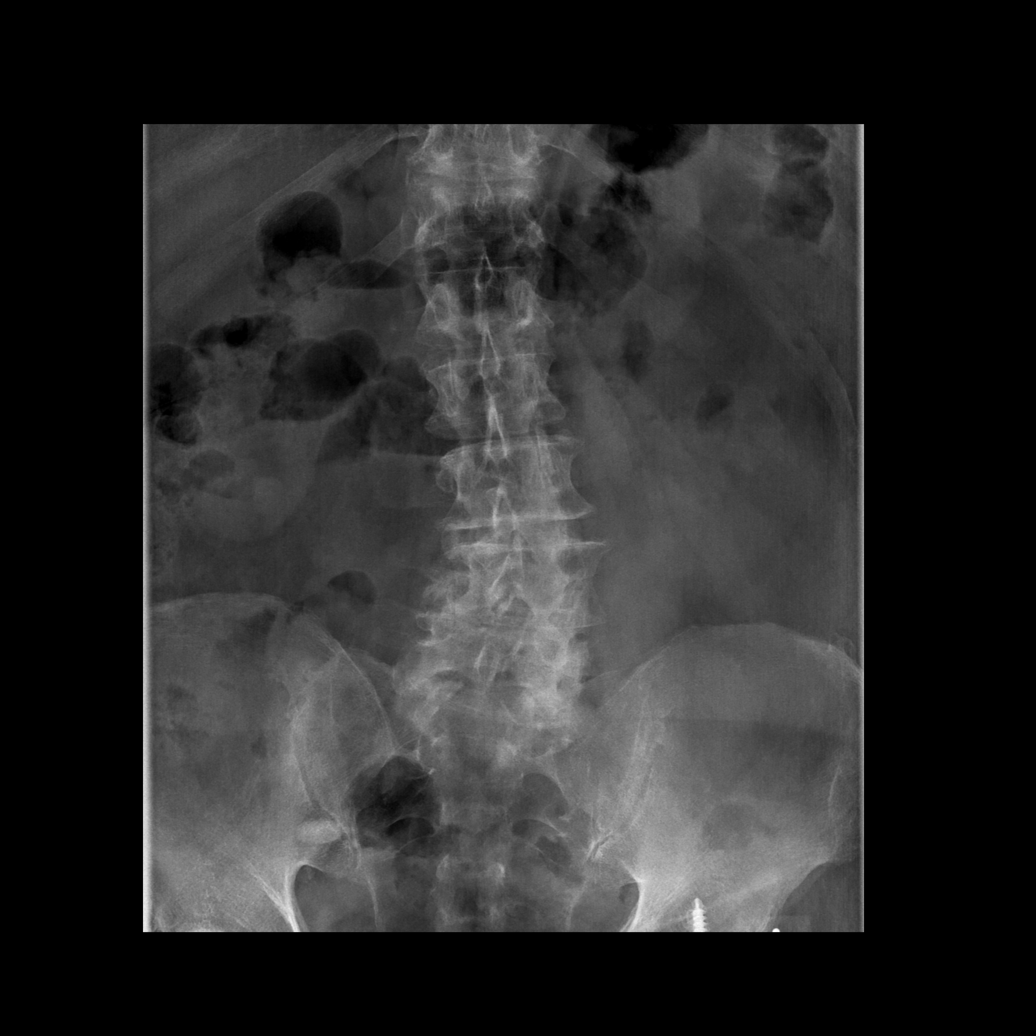
[im 2/4]
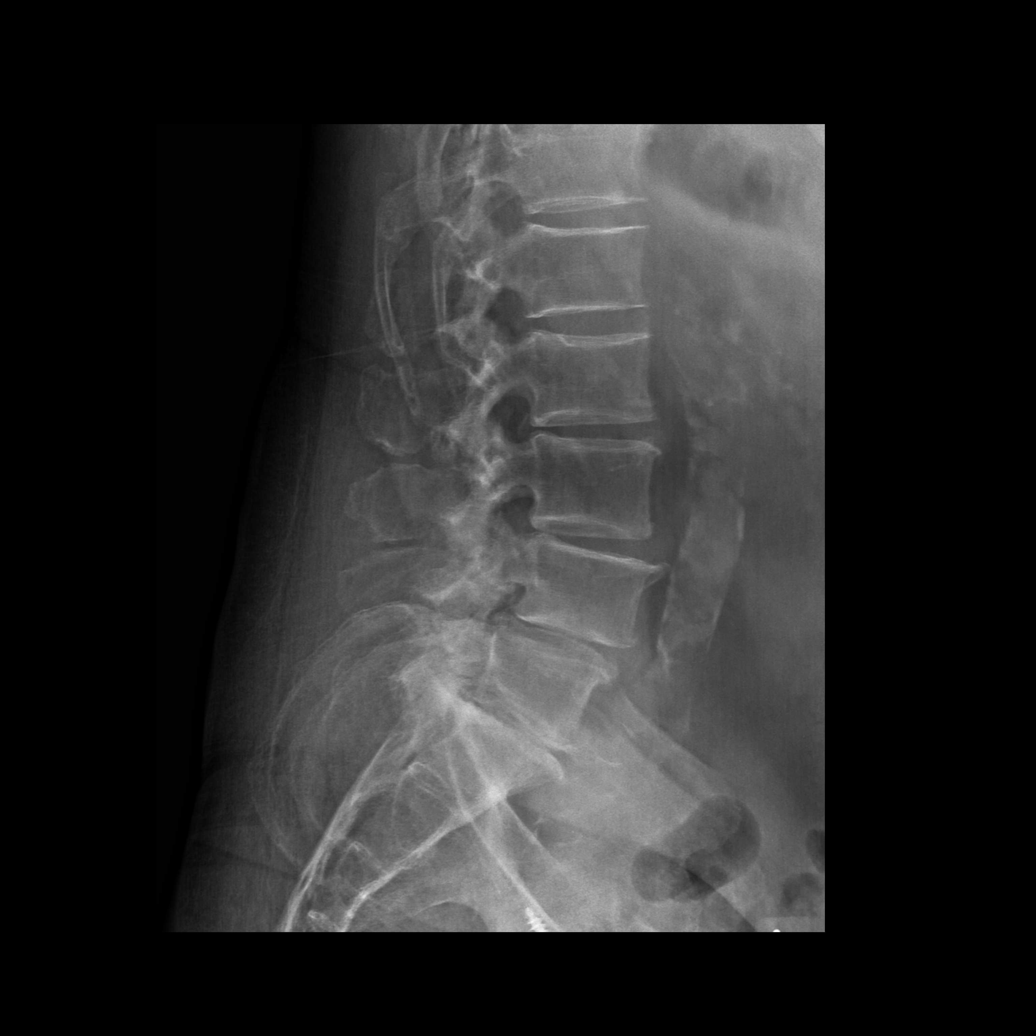
[im 3/4]
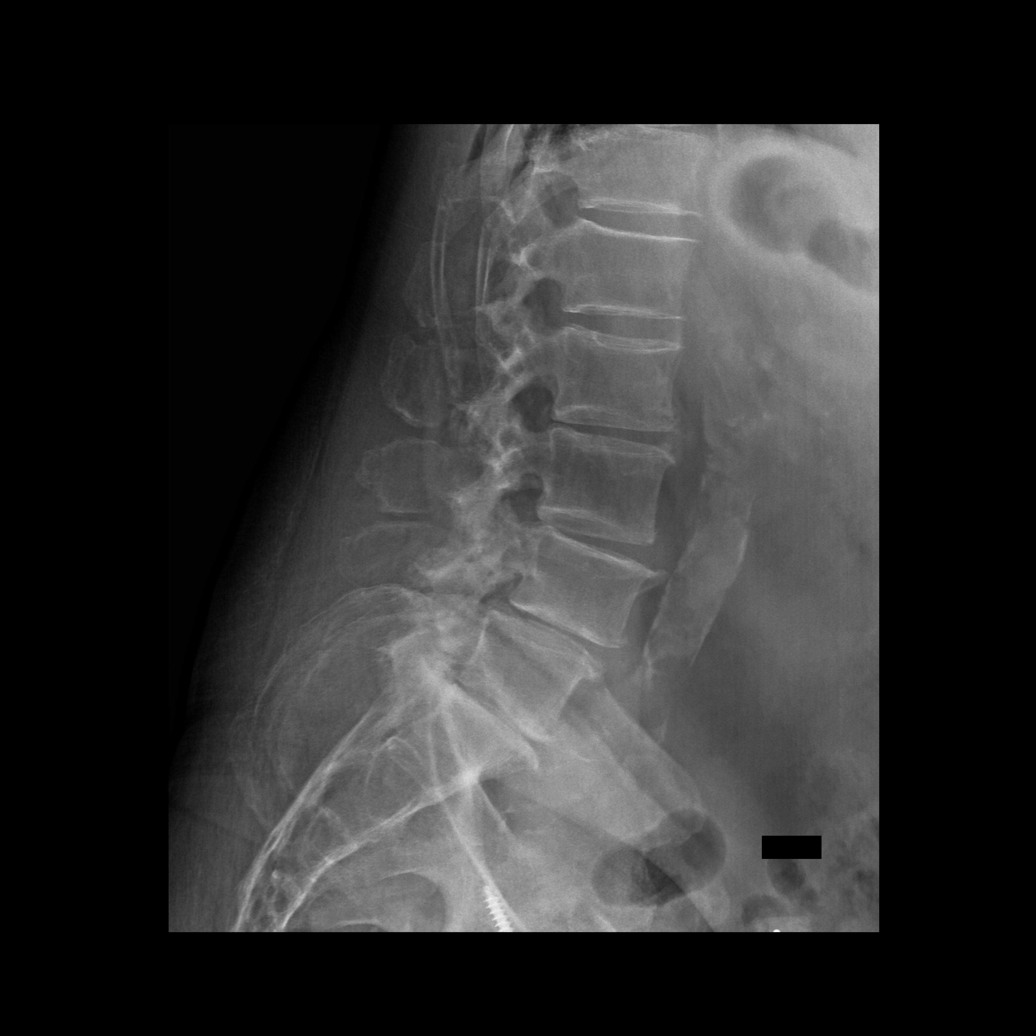
[im 4/4]
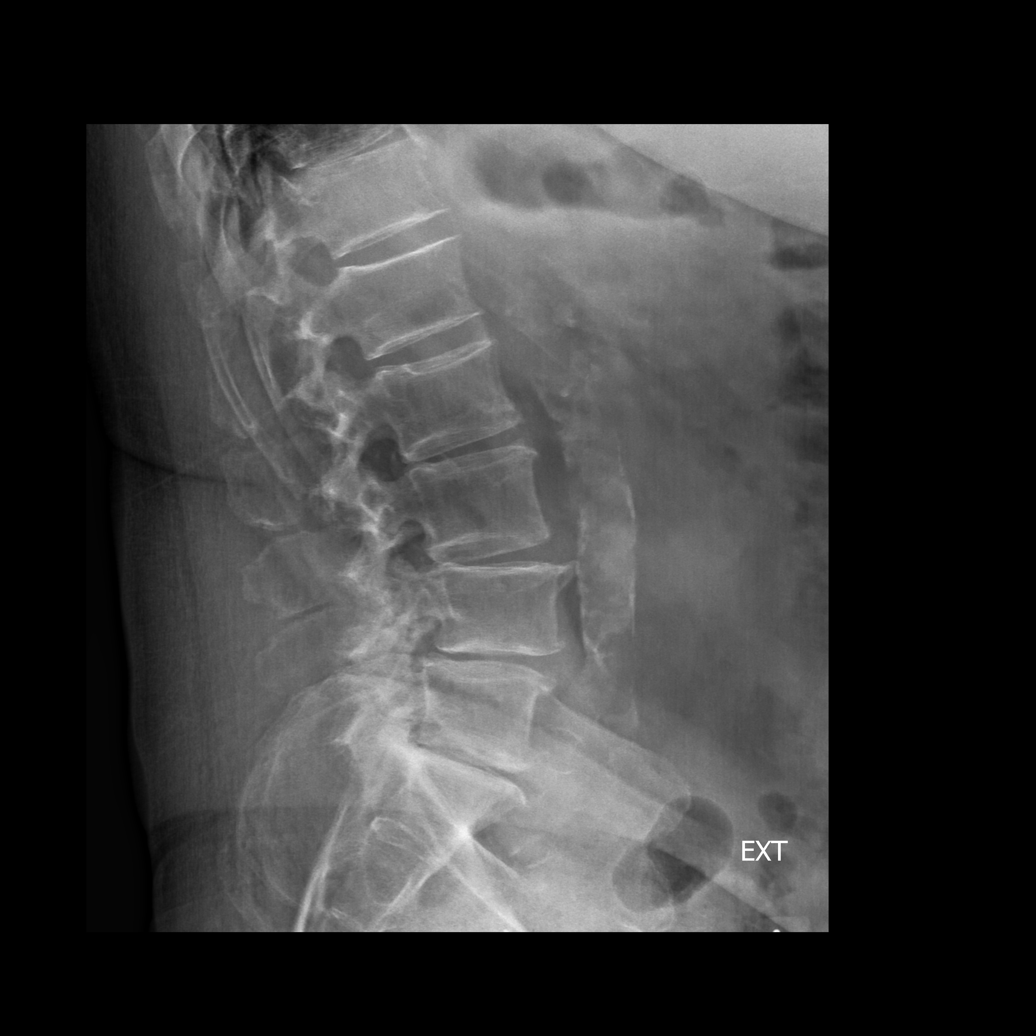

[4 of 4 positions shown; findings below may reference images not displayed]

FINDINGS: 5 lumbar type vertebral bodies. Mild levoconvex lower lumbar 
scoliosis. Left hip arthroplasty. 8 mm anterolisthesis L4 on L5 appears to be 
degenerative as there is no definite evidence of pars defects. This reduces to 4 
mm with extension consistent with a degree of dynamic instability. Vertebral 
body height preserved. Otherwise there is anatomic sagittal alignment. There are 
severe DDD changes suboptimally visualized at T10-T11. Multilevel variable 
degrees of loss of disc height most significant at L5-S1. No acute fracture. 
Osteopenia. Atherosclerotic changes. Mild degenerative changes about both SI 
joints. Presumed bone island right ilium.
IMPRESSION: Mild dynamic instability L4 on L5. Other lumbar degenerative changes. Correlate 
with upcoming MRI lumbar spine. 
Osteopenia. Advise further assessment with DEXA with trabecular bone score, if 
not already performed.

## 2024-03-22 ENCOUNTER — Other Ambulatory Visit: Payer: Self-pay

## 2024-03-26 ENCOUNTER — Other Ambulatory Visit: Payer: Self-pay

## 2024-03-26 IMAGING — MR MRI LUMBAR SPINE WITHOUT CONTRAST
4 of 8 series · 6 of 48 positions shown · IV contrast (gadolinium)
Comparison: MRI lumbar spine from January 26, 2023.

________________________________________________________________________________________________ 
MRI LUMBAR SPINE WITHOUT CONTRAST, 03/26/2024 [DATE]: 
CLINICAL INDICATION: Chronic midline low back pain.
TECHNIQUE: Multiplanar, multiecho position MR images of the lumbar spine were 
performed without intravenous gadolinium enhancement.

[Series 101: survey · axial · 10.0mm · 1.39mm/px · 1 of 9 slices shown]
[im 1/9]
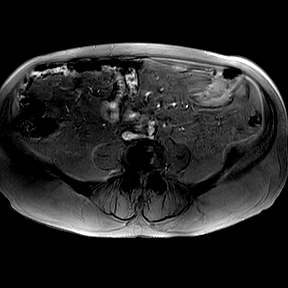

[Series 201: t2w_cor-surv/ls · coronal · 6.0mm · 0.50mm/px · 1 of 5 slices shown]
[im 1/5]
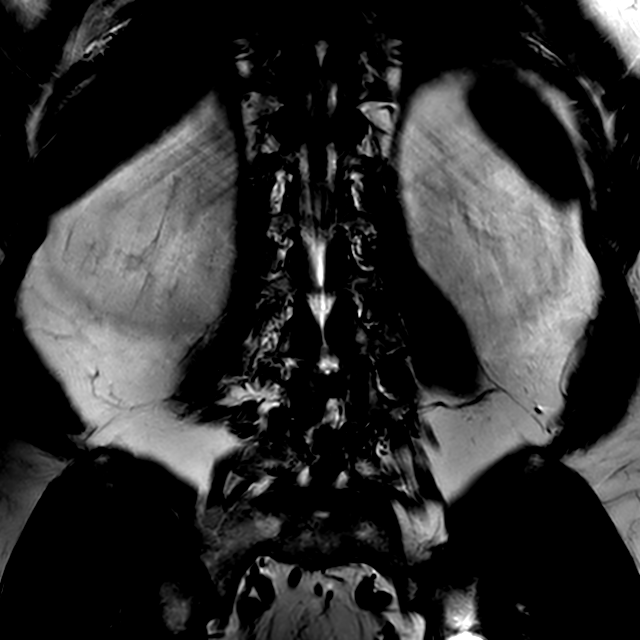

[Series 301: t1w_tse sag · sagittal · 4.0mm · 0.25mm/px · 2 of 17 slices shown]
[im 1/17]
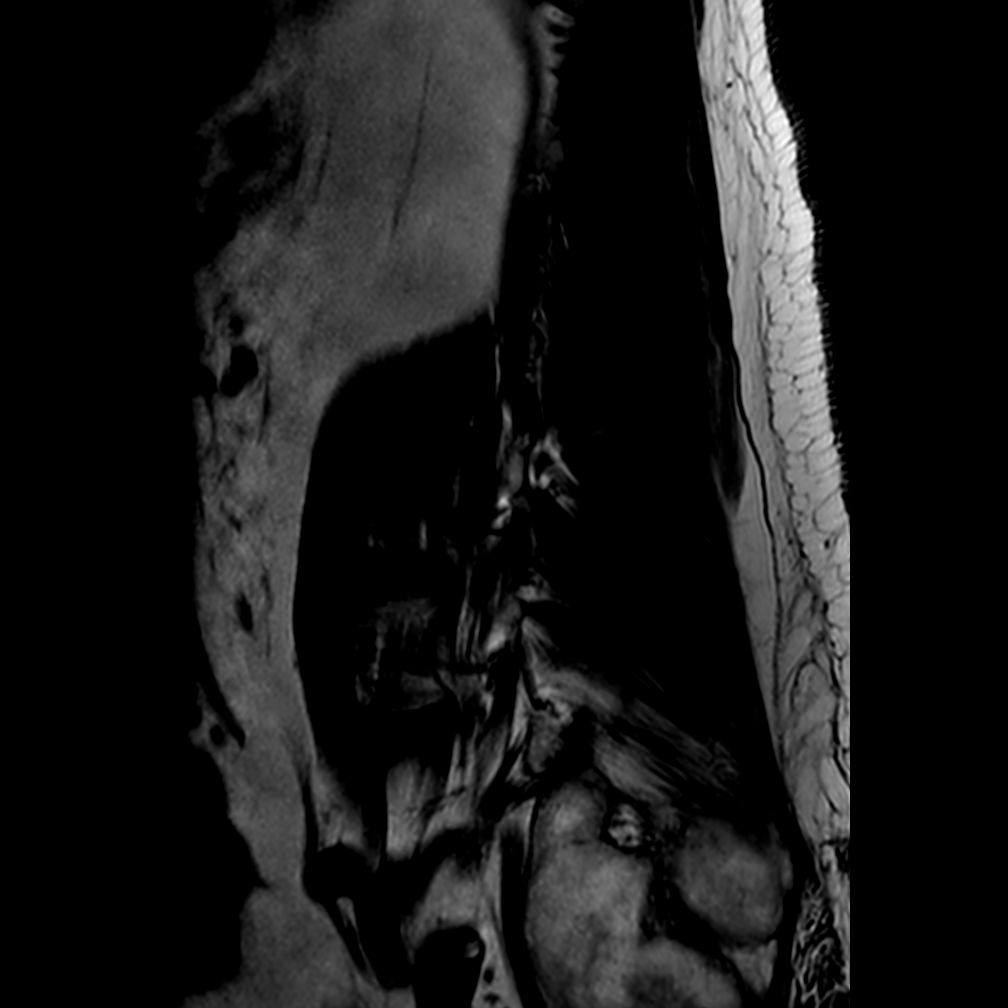
[im 17/17]
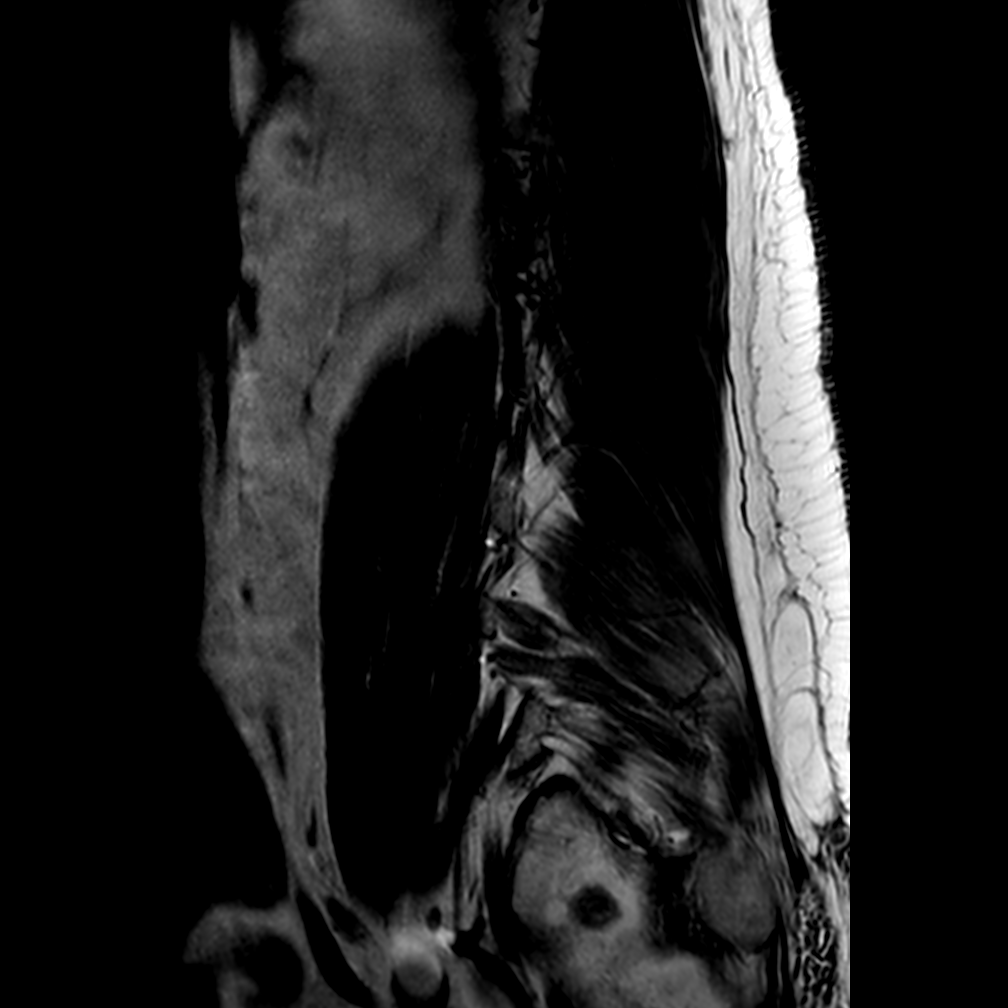

[Series 401: t2w_tse sag · sagittal · 4.0mm · 0.25mm/px · 2 of 17 slices shown]
[im 1/17]
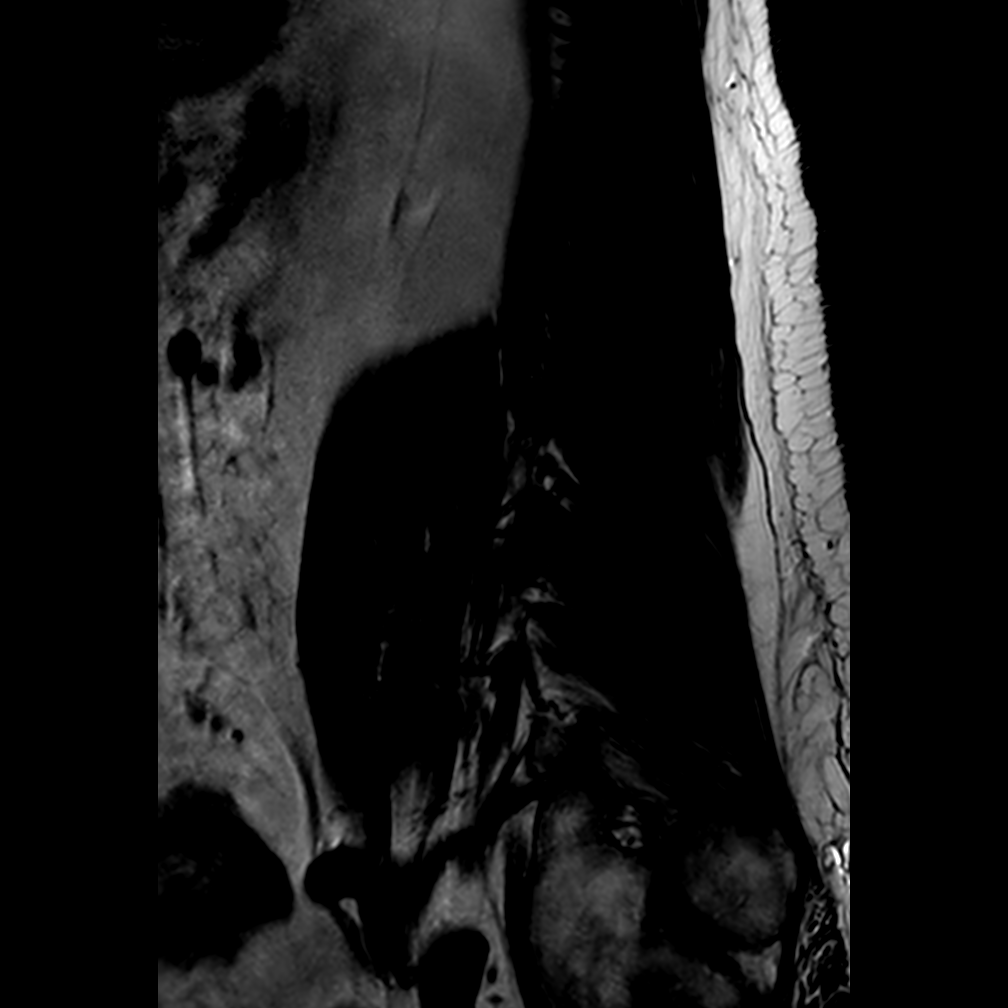
[im 9/17]
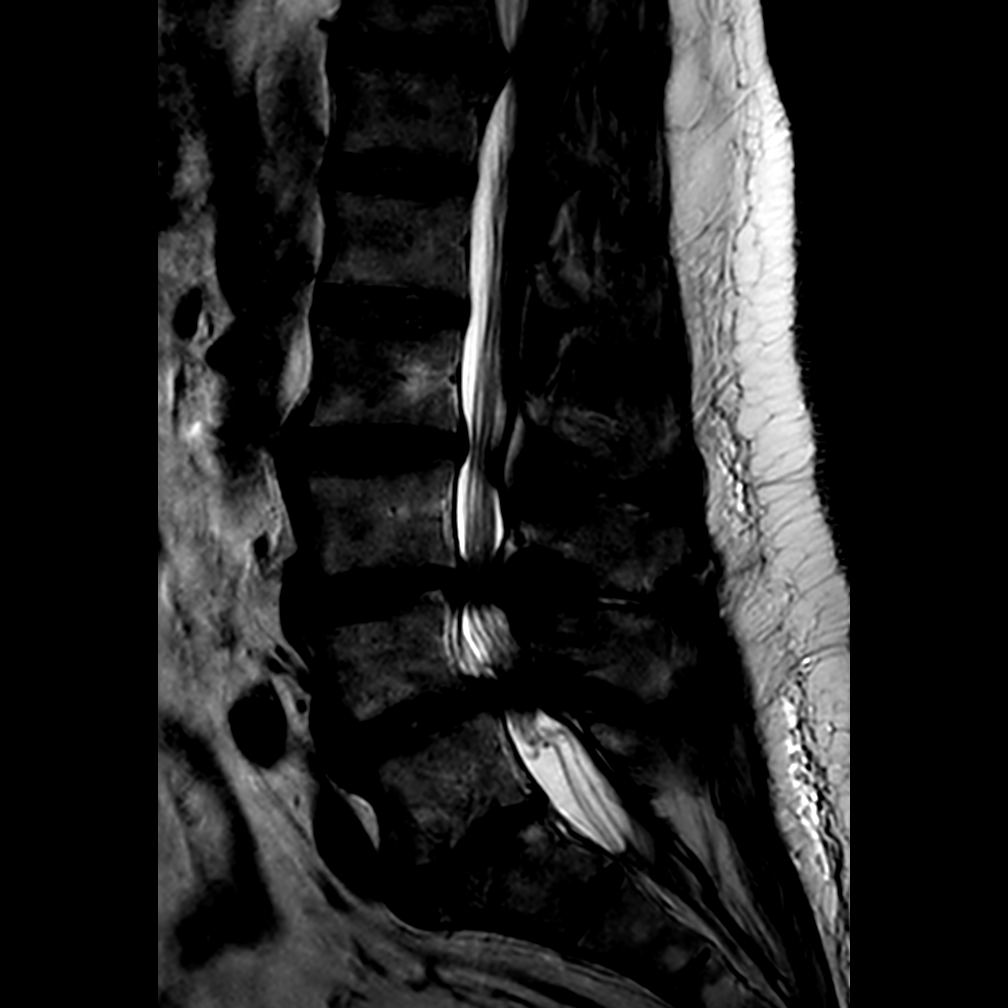

[6 of 48 positions shown; findings below may reference images not displayed]

FINDINGS: -------------------------------------------------------------------------------- 
------ 
GENERAL: 
Nomenclature is based on 5 lumbar type vertebral bodies.     
ALIGNMENT: Congenital short pedicles. Leftward curvature of the lower lumbar 
spine. Mild anterolisthesis of L4 on L5. 
VERTEBRAL BODY HEIGHT: Normal.  
MARROW SIGNAL: Schmorls node along the inferior endplate of L3. 
CORD SIGNAL: Normal distal spinal cord and cauda equina.  Conus terminates at L2 
superior endplate level. 
ADDITIONAL FINDINGS: Renal parapelvic cysts.. 
Modic I-II: Modic change at L3-L4, L4-L5, L5-S1. 
Ligamentum Flavum > 2.5 mm: All levels. 
-------------------------------------------------------------------------------- 
------ 
SEGMENTAL: 
T11-T12: Disc bulge with broad-based central disc herniation, bilateral facet 
hypertrophy; deformity of the cord with moderate central canal narrowing. 
Moderate left and mild right neural foraminal narrowing. 
T12-L1: No significant central canal narrowing.  No significant right neural 
foraminal narrowing. No significant left neural foraminal narrowing.  
L1-L2: No significant central canal narrowing.  No significant right neural 
foraminal narrowing. No significant left neural foraminal narrowing.  
L2-L3: Disc bulge eccentric to the right, bilateral facet/ligamentum flavum 
hypertrophy; mild central canal narrowing with moderate bilateral subarticular 
recess narrowing.  No significant right neural foraminal narrowing. Mild left 
neural foraminal narrowing.  
L3-L4: Uncovering of the disc space with disc bulge, bilateral facet/ligamentum 
flavum hypertrophy; severe central canal and bilateral subarticular recess 
narrowing.  Moderate right neural foraminal narrowing. Moderate left neural 
foraminal narrowing.  
L4-L5: Uncovering of the disc space with disc bulge, bilateral facet 
hypertrophy; severe central canal and bilateral subarticular recess narrowing.  
Severe right neural foraminal narrowing. Moderate left neural foraminal 
narrowing.  
L5-S1: Uncovering of the disc space with loss of disc height and disc bulge, 
bilateral facet hypertrophy; no significant central canal narrowing.  Moderate 
right neural foraminal narrowing. Severe left neural foraminal narrowing.  
-------------------------------------------------------------------------------- 
------ 
CHANGES FROM PRIOR: 
No significant change compared to prior MRI. 
-------------------------------------------------------------------------------- 
------
IMPRESSION: 1.  Discogenic/degenerative changes as above. 
2.  Cord deformity at T11-T12. 
3.  Severe central canal and bilateral subarticular recess narrowing at L3-L4 
and L4-L5. 
4.  Severe neural foraminal narrowing at L4-L5 (right), L5-S1 (left).

## 2024-04-02 ENCOUNTER — Other Ambulatory Visit: Payer: Self-pay

## 2024-04-12 MED ORDER — PEG 3350-KCL-NABCB-NACL-NASULF 236 GM PO SOLR *I*
ORAL | 0 refills | Status: DC
Start: 2024-04-12 — End: 2024-06-12

## 2024-04-12 MED ORDER — BISACODYL EC 5 MG PO TBEC *I*
DELAYED_RELEASE_TABLET | ORAL | 0 refills | Status: DC
Start: 2024-04-12 — End: 2024-06-12

## 2024-04-12 NOTE — Telephone Encounter (Signed)
Prep sent  Corey Harold, PA

## 2024-04-15 ENCOUNTER — Other Ambulatory Visit: Payer: Self-pay

## 2024-04-16 ENCOUNTER — Other Ambulatory Visit: Payer: Self-pay

## 2024-04-17 ENCOUNTER — Other Ambulatory Visit: Payer: Self-pay

## 2024-04-17 ENCOUNTER — Other Ambulatory Visit: Payer: Self-pay | Admitting: Cardiology

## 2024-04-17 DIAGNOSIS — E785 Hyperlipidemia, unspecified: Secondary | ICD-10-CM

## 2024-04-17 MED ORDER — PRALUENT 75 MG/ML SC SOAJ
SUBCUTANEOUS | 3 refills | Status: DC
  Filled 2024-04-17 – 2024-05-09 (×2): qty 6, 84d supply, fill #0
  Filled 2024-07-29: qty 6, 84d supply, fill #1
  Filled 2024-09-17: qty 6, 84d supply, fill #2

## 2024-04-18 ENCOUNTER — Encounter: Payer: Self-pay | Admitting: Pharmacist

## 2024-04-18 ENCOUNTER — Other Ambulatory Visit: Payer: Self-pay

## 2024-04-22 ENCOUNTER — Other Ambulatory Visit: Payer: Self-pay

## 2024-04-24 ENCOUNTER — Other Ambulatory Visit: Payer: Self-pay

## 2024-05-01 ENCOUNTER — Other Ambulatory Visit: Payer: Self-pay

## 2024-05-09 ENCOUNTER — Other Ambulatory Visit: Payer: Self-pay

## 2024-05-09 ENCOUNTER — Other Ambulatory Visit: Payer: Self-pay | Admitting: Pharmacist

## 2024-05-09 DIAGNOSIS — E785 Hyperlipidemia, unspecified: Secondary | ICD-10-CM

## 2024-05-13 ENCOUNTER — Other Ambulatory Visit: Payer: Self-pay

## 2024-05-14 ENCOUNTER — Other Ambulatory Visit: Payer: Self-pay

## 2024-05-21 ENCOUNTER — Other Ambulatory Visit: Payer: Medicare Other | Admitting: Gastroenterology

## 2024-06-04 ENCOUNTER — Other Ambulatory Visit
Admission: RE | Admit: 2024-06-04 | Discharge: 2024-06-04 | Disposition: A | Discharge status: Home / Self Care | Source: Ambulatory Visit | Attending: Family Medicine | Admitting: Family Medicine

## 2024-06-04 ENCOUNTER — Ambulatory Visit: Payer: Self-pay | Admitting: Family Medicine

## 2024-06-04 DIAGNOSIS — Z1159 Encounter for screening for other viral diseases: Secondary | ICD-10-CM | POA: Insufficient documentation

## 2024-06-04 DIAGNOSIS — E785 Hyperlipidemia, unspecified: Secondary | ICD-10-CM | POA: Insufficient documentation

## 2024-06-04 DIAGNOSIS — Z114 Encounter for screening for human immunodeficiency virus [HIV]: Secondary | ICD-10-CM | POA: Insufficient documentation

## 2024-06-04 DIAGNOSIS — E119 Type 2 diabetes mellitus without complications: Secondary | ICD-10-CM | POA: Insufficient documentation

## 2024-06-04 LAB — MICROALBUMIN, URINE, RANDOM
Creatinine,UR: 228 mg/dL (ref 20–300)
Microalbumin,UR: <1.2 mg/dL

## 2024-06-04 LAB — COMPREHENSIVE METABOLIC PANEL
ALT: 17 U/L (ref 0–50)
AST: 19 U/L (ref 0–50)
Albumin: 3.9 g/dL (ref 3.5–5.2)
Alk Phos: 115 U/L (ref 40–130)
Anion Gap: 13 (ref 7–16)
Bilirubin,Total: 0.3 mg/dL (ref 0.0–1.2)
CO2: 22 mmol/L (ref 20–28)
Calcium: 9.9 mg/dL (ref 8.6–10.2)
Chloride: 103 mmol/L (ref 96–108)
Creatinine: 1.01 mg/dL (ref 0.67–1.17)
Glucose: 126 mg/dL — ABNORMAL HIGH (ref 60–99)
Lab: 13 mg/dL (ref 6–20)
Potassium: 5 mmol/L (ref 3.3–5.1)
Sodium: 138 mmol/L (ref 133–145)
Total Protein: 6.9 g/dL (ref 6.3–7.7)
eGFR BY CREAT: 78 *

## 2024-06-04 LAB — LIPID PANEL
Chol/HDL Ratio: 3.3
Cholesterol: 157 mg/dL
HDL: 48 mg/dL (ref 40–60)
LDL Calculated: 89 mg/dL
Non HDL Cholesterol: 109 mg/dL
Triglycerides: 112 mg/dL

## 2024-06-04 LAB — HEMOGLOBIN A1C: Hemoglobin A1C: 6.2 % — ABNORMAL HIGH

## 2024-06-04 LAB — HEPATITIS C VIRUS ANTIBODY WITH REFLEX TO HEPATITIS C QUANTITATIVE: Hep C Ab: NEGATIVE

## 2024-06-04 LAB — HIV-1/2  ANTIGEN/ANTIBODY SCREEN WITH CONFIRMATION: HIV 1&2 ANTIGEN/ANTIBODY: NONREACTIVE

## 2024-06-04 NOTE — Telephone Encounter (Signed)
 Patient informed, stated understanding of instructions.

## 2024-06-10 LAB — HM DIABETES EYE EXAM

## 2024-06-11 ENCOUNTER — Ambulatory Visit: Payer: Medicare Other | Admitting: Family Medicine

## 2024-06-12 ENCOUNTER — Encounter: Payer: Self-pay | Admitting: Family Medicine

## 2024-06-12 ENCOUNTER — Other Ambulatory Visit: Payer: Self-pay

## 2024-06-12 ENCOUNTER — Ambulatory Visit: Payer: Medicare Other | Attending: Family Medicine | Admitting: Family Medicine

## 2024-06-12 ENCOUNTER — Other Ambulatory Visit: Payer: Self-pay | Admitting: Family Medicine

## 2024-06-12 VITALS — BP 132/70 | HR 86 | Temp 97.0°F | Ht 66.0 in | Wt 211.0 lb

## 2024-06-12 DIAGNOSIS — I10 Essential (primary) hypertension: Secondary | ICD-10-CM | POA: Insufficient documentation

## 2024-06-12 DIAGNOSIS — E782 Mixed hyperlipidemia: Secondary | ICD-10-CM | POA: Insufficient documentation

## 2024-06-12 DIAGNOSIS — E119 Type 2 diabetes mellitus without complications: Secondary | ICD-10-CM

## 2024-06-12 DIAGNOSIS — G8929 Other chronic pain: Secondary | ICD-10-CM | POA: Insufficient documentation

## 2024-06-12 DIAGNOSIS — M545 Low back pain, unspecified: Secondary | ICD-10-CM | POA: Insufficient documentation

## 2024-06-12 MED ORDER — SEMAGLUTIDE (1 MG/DOSE) 4 MG/3ML SC SOPN *I*
1.0000 mg | PEN_INJECTOR | SUBCUTANEOUS | 3 refills | Status: AC
Start: 2024-06-12 — End: ?

## 2024-06-12 MED ORDER — SEMAGLUTIDE (1 MG/DOSE) 4 MG/3ML SC SOPN *I*
1.0000 mg | PEN_INJECTOR | SUBCUTANEOUS | 3 refills | Status: DC
Start: 2024-06-12 — End: 2024-06-12
  Filled 2024-06-12: qty 3, 28d supply, fill #0

## 2024-06-12 NOTE — Telephone Encounter (Signed)
 Patient made a mistake, he needs Ozempic  to go to [facility]

## 2024-06-12 NOTE — Progress Notes (Signed)
 Subjective:     Patient ID: Joe Roman is a 73 y.o. male.    HPI    History of Present Illness  The patient is a 73 year old male who presents today for follow-up.  He had blood work done prior to the office visit.    Type 2 diabetes: His blood glucose level was recorded at 121, a decrease from previous readings of 125 and 126. In 01/2024, during a scheduled colonoscopy, his blood glucose level was 118. He initiated Ozempic  therapy in the first week of 12/2023, resulting in a weight reduction from 211 to 203 pounds. A year ago, his weight was approximately 227 pounds. He acknowledges a slight weight gain due to recent travel but overall, he is satisfied with his progress.  He has been on Ozempic  for several weeks, with the dosage recently increased from 0.25 to 0.5. He experienced some digestive issues during the initial three weeks of treatment but has since adjusted well. He reports a decrease in food cravings and portion sizes since starting Ozempic . He expresses a desire to further reduce his blood glucose levels to around 100. He has been advised to lose weight by his physician in [place] , which he has successfully done, reducing his weight from 227 to 210 pounds. He has two months' supply of Ozempic  0.25 and 0.5 left and is considering increasing the dosage to 1 mg. He is currently on Ozempic  0.5 mg.  He had a diabetic eye exam on Monday, which was normal. He reports no numbness or tingling in his feet.    Chronic lower back pain: He has been experiencing back pain for the past three years, which has limited his ability to exercise. Despite attempts at physical therapy and cortisone injections, his symptoms persisted. A recent diagnosis of a slipped disc led to surgical intervention two months ago, which he reports as successful. He has been gradually increasing his walking distance post-surgery and has joined a country club for swimming. He uses a vest to aid in fusion and has an upcoming x-ray scheduled  for 10/14/2024. He reports feeling well post-surgery, with no morning pain. He has not experienced any complications from the surgery and has been cleared to drive and fly. He was prescribed oxycodone post-surgery but found Tylenol sufficient for pain management.    Hypertension: Denies chest pain, shortness of breath, headache, change in vision, or lower extreme edema.    Hyperlipidemia: He is followed by cardiology, Dr. Colton Dearth, and on Praluent  injections.  He is tolerating these well.    He had a colonoscopy in 01/2024, during which polyps were found but no cancer was detected.    PAST SURGICAL HISTORY:  - Back surgery for slipped disc with fusion of vertebrae four and five, and two and three, performed two months ago.  - Colonoscopy in 01/2024.       Joe Roman has a current medication list which includes the following prescription(s): praluent , doxylamine (sleep), mometasone , amlodipine , magnesium oxide, alprazolam, metoprolol tartrate, aspirin, multiple vitamins-minerals, cyanocobalamin, and semaglutide  (1 mg/dose).  Joe Roman has No Known Allergies (drug, envir, food or latex).    Review of Systems  Denies chest pain or shortness of breath.      Objective:   Physical Exam  BP 132/70 (BP Location: Left arm, Patient Position: Sitting)   Pulse 86   Temp 36.1 C (97 F)   Ht 1.676 m (5' 6)   Wt 95.7 kg (211 lb)   SpO2 95%   BMI 34.06 kg/m  General: Well developed, well nourished, no acute distress.  Cardiovascular: Regular rate and rhythm, no murmurs, no rubs, normal S1 and S2, no carotid bruits.  Respiratory: Lungs clear to auscultation, no wheezes, no rhonchi.  Extremities: 2+ pedal pulses, no edema, capillary refill under 3 seconds.  Diabetic foot exam: Feet are clean without any onychomycosis, other fungal infection, joint deformity, ulceration, or significant callous. 10/10 microfilament.            Plan:      Assessment & Plan  Type 2 diabetes Mellitus.  - A1c level has decreased from 6.5 to 6.2, but patient  desires further improvement.  - Increased dosage of Ozempic  to 1 mg.  - Advised to report any adverse effects.   -Follow-up in 4 to 6 months with PCP or sooner for any acute needs.    2.  Hypertension.  - Blood pressure at goal.  Continue current medications.    3. Hypercholesterolemia.  - Cholesterol levels are within the normal range, indicating effective management.  - No issues reported with current medication.  - Follow-up with cardiology in July as planned.    4. Back Pain.  - Underwent back surgery two months ago and is recovering well.  - Advised to continue walking and swimming to avoid the need for physical therapy.  - Scheduled to see surgeon on 10/14/2024 for an x-ray.

## 2024-07-01 ENCOUNTER — Encounter: Payer: Self-pay | Admitting: Cardiology

## 2024-07-01 ENCOUNTER — Other Ambulatory Visit: Payer: Self-pay

## 2024-07-01 ENCOUNTER — Ambulatory Visit: Attending: Cardiology | Admitting: Cardiology

## 2024-07-01 VITALS — BP 124/72 | HR 73 | Ht 66.0 in | Wt 211.0 lb

## 2024-07-01 DIAGNOSIS — I440 Atrioventricular block, first degree: Secondary | ICD-10-CM | POA: Insufficient documentation

## 2024-07-01 DIAGNOSIS — E785 Hyperlipidemia, unspecified: Secondary | ICD-10-CM | POA: Insufficient documentation

## 2024-07-01 DIAGNOSIS — Z789 Other specified health status: Secondary | ICD-10-CM | POA: Insufficient documentation

## 2024-07-01 DIAGNOSIS — I213 ST elevation (STEMI) myocardial infarction of unspecified site: Secondary | ICD-10-CM | POA: Insufficient documentation

## 2024-07-01 NOTE — Progress Notes (Signed)
 [facility]        Cardiology Office Revisit Note    Date of Visit: 07/01/2024 Patient: Joe Roman   Patients PCP: Thakor, Saheen, MD Patient DOB: 1951/02/01  EMRN: [id number]     Subjective/Reason For Visit     I had the pleasure of seeing Joe Roman in cardiology followup on 07/01/2024. Stable from cardiac standpoint since seen; tolerates Rx and lipids well controlled.    Had back surgery in [place]  and now more active, swimming and walking.    Appears comfortable.    Past Medical History:   Diagnosis Date    Anxiety     Arthritis     GERD (gastroesophageal reflux disease)     Hyperlipemia     Hypertension      Past Surgical History:   Procedure Laterality Date    KNEE REPLACEMENT Bilateral     left hip replacement Left     SPINAL FUSION       ROS  Medications     Current Outpatient Medications   Medication Sig    semaglutide , 1 mg/dose, (OZEMPIC ) 4 MG/3ML pen Inject 1 mg into the skin once a week.    alirocumab  (PRALUENT ) 75 mg/mL pen injector Inject 1 pen (75 mg) into the skin every 14 days    Doxylamine Succinate, Sleep, (UNISOM PO) Take by mouth.    mometasone  (NASONEX ) 50 MCG/ACT nasal spray Spray 2 sprays into each nostril daily for Inflammation of the Sinuses and the Nose.    amLODIPine  (NORVASC ) 10 mg tablet TAKE 1 TABLET (10 MG TOTAL) BY MOUTH DAILY FOR HIGH BLOOD PRESSURE DISORDER.    magnesium oxide (MAG-OX) 400 (241.3 mg) mg tablet Take 0.5 tablets (200 mg total) by mouth daily.    metoprolol tartrate (LOPRESSOR) 25 mg tablet Take 0.5 tablets (12.5 mg total) by mouth 2 times daily.    aspirin 81 mg EC tablet Take 1 tablet (81 mg total) by mouth daily.    Multiple Vitamins-Minerals (MULTIVITAMIN ADULTS 50+ PO) Take 1 tablet by mouth daily    cyanocobalamin (VITAMIN B-12) 500 mcg tablet Take 1 tablet (500 mcg total) by mouth daily.    ALPRAZolam (XANAX) 0.25 mg tablet Take 1 tablet (0.25 mg total) by mouth 2 times daily as needed for Anxiety.     Vitals and Physical Exam     Joe Roman   height is 1.676 m (5' 6) and weight is 95.7 kg (211 lb). His blood pressure is 124/72 and his pulse is 73. His oxygen saturation is 95%.  Body mass index is 34.06 kg/m.    Physical Exam  Constitutional:       General: He is not in acute distress.     Appearance: He is well-developed.   HENT:      Head: Normocephalic and atraumatic.   Neck:      Vascular: No JVD.   Cardiovascular:      Rate and Rhythm: Normal rate and regular rhythm.      Heart sounds: Normal heart sounds. No murmur heard.     No friction rub. No gallop.   Pulmonary:      Effort: Pulmonary effort is normal. No respiratory distress.      Breath sounds: Normal breath sounds. No wheezing or rales.   Skin:     General: Skin is warm.   Neurological:      Mental Status: He is alert.       Laboratory Data     Hematology:  No results found for requested labs within last 730 days.     Chemistry:   Results in Past 730 Days  Result Component Current Result Previous Result   Sodium 138 (06/04/2024) 141 (04/28/2023)   Potassium 5.0 (06/04/2024) 4.7 (04/28/2023)   Creatinine 1.01 (06/04/2024) 0.93 (04/28/2023)   Glucose 126 (H) (06/04/2024) 129 (H) (04/28/2023)   Calcium 9.9 (06/04/2024) 9.6 (04/28/2023)   Hemoglobin A1C 6.2 (H) (06/04/2024) 6.5 (H) (09/14/2023)   AST 19 (06/04/2024) 25 (09/14/2023)   ALT 17 (06/04/2024) 40 (09/14/2023)   TSH 2.55 (09/14/2023) Not in Time Range     Coagulation Studies:   No results found for requested labs within last 730 days.     Cardiac:   No results found for requested labs within last 730 days.     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 male with atherosclerosis doing ok on current Rx; no changes made; see back in one year. Plan explained and accepted.          Joe GORMAN BAAR, MD  Electronically signed on 07/01/2024 at 8:33 AM.

## 2024-07-04 LAB — EKG 12-LEAD
P: -12 deg
PR: 257 ms
QRS: -54 deg
QRSD: 89 ms
QT: 394 ms
QTc: 413 ms
Rate: 66 {beats}/min
T: -59 deg

## 2024-07-29 ENCOUNTER — Other Ambulatory Visit: Payer: Self-pay

## 2024-07-31 ENCOUNTER — Other Ambulatory Visit: Payer: Self-pay

## 2024-07-31 DIAGNOSIS — E785 Hyperlipidemia, unspecified: Secondary | ICD-10-CM

## 2024-08-05 ENCOUNTER — Encounter: Payer: Self-pay | Admitting: Family Medicine

## 2024-08-06 ENCOUNTER — Ambulatory Visit

## 2024-08-06 ENCOUNTER — Other Ambulatory Visit: Payer: Self-pay

## 2024-08-06 VITALS — BP 136/69 | HR 80 | Temp 97.0°F | Ht 66.0 in | Wt 209.0 lb

## 2024-08-06 DIAGNOSIS — Z79899 Other long term (current) drug therapy: Secondary | ICD-10-CM | POA: Insufficient documentation

## 2024-08-06 DIAGNOSIS — F411 Generalized anxiety disorder: Secondary | ICD-10-CM | POA: Insufficient documentation

## 2024-08-06 DIAGNOSIS — I10 Essential (primary) hypertension: Secondary | ICD-10-CM | POA: Insufficient documentation

## 2024-08-06 MED ORDER — FLUOXETINE HCL 10 MG PO CAPS *I*
10.0000 mg | ORAL_CAPSULE | Freq: Every day | ORAL | 2 refills | Status: DC
Start: 2024-08-06 — End: 2024-08-29

## 2024-08-06 NOTE — Progress Notes (Signed)
 [facility] Subjective Joe Roman is a 73 y.o. male who presents for Depression and AnxietyHistory of Present IllnessThe patient presents for evaluation of depression and anxiety.He has been under the care of Dr. Stephens for slightly over a year, with his last visit in 05/2024. During that visit, he saw Joe Roman, and all his blood work, including cholesterol levels, were within normal limits. He reports experiencing anxiety for the past few years, which he attributes to political events in the country. Despite trying to limit his news consumption, he continues to feel distressed. He has a history of losing his brother at the age of 49 and recently dealt with the loss of his mother in 2020. He managed his mother's care and dealt with another brother's drug problem. Currently, he is dealing with family issues.He spends half the year in [place]  and the other half here. With a background in psychology, he does not believe he is depressed but acknowledges his anxiety about world events. He is open to trying new treatments for his anxiety. He used to take Lexapro 10 mg for anxiety years ago, but states it made him feel angry after a couple of months. He maintains an active lifestyle, exercising daily, swimming, and walking. He has noticed that he sleeps well on days when he exercises in the morning and swims in the afternoon. He is considering online therapy and is interested in trying a new prescription. He reports no chest pain or shortness of breath. He was prescribed Xanax, which he takes occasionally at night and as needed for panic attacks which is prescribed by his physician in [place] . He also takes Unisom as recommended by Dr. Stephens, which helps him sleep. He is on metoprolol for cardiac issues and takes baby aspirin daily. He saw a cardiologist in [place]  due to chest pain, which was attributed to GERD and anxiety. He had a stress test and echocardiogram about 4 to 5 years ago, both  of which were normal. He had a treadmill stress test with another cardiologist, which showed a high calcium index. He is under the care of Dr. Jacklin, who prescribed a cholesterol injection due to issues with Lipitor. He denies any current chest pain or shortness of breath.He started Ozempic  in 12/2023 and has lost weight, going from 227 pounds to 209 pounds.ROSPertinent positives and negatives as per HPIMarital Status: MarriedEducation Level: Two degrees in psychologyOccupation: Retired psychology teacherHobbies: Exercising, swimming, walkingCoffee/Tea/Caffeine-containing Drinks: Drinks coffeeSleep: Sleeps well on days with exercise; uses Unisom occasionallyLiving Condition: Splits time between [place]  and another locationPAST SURGICAL HISTORY:- Knee replacements- Back surgery in 03/2024 Objective Blood pressure 136/69, pulse 80, temperature 36.1 C (97 F), height 1.676 m (5' 6), weight 94.8 kg (209 lb), SpO2 99%.Physical ExamConstitutional:     Appearance: Normal appearance. Cardiovascular:    Rate and Rhythm: Normal rate and regular rhythm.    Heart sounds: Normal heart sounds. No murmur heard.   No friction rub. No gallop. Pulmonary:    Effort: Pulmonary effort is normal. No respiratory distress.    Breath sounds: Normal breath sounds. No stridor. No wheezing, rhonchi or rales. Psychiatric:       Behavior: Behavior normal.       Thought Content: Thought content normal.       Judgment: Judgment normal.    Comments: Mildly anxious affect.  Tearful during exam but emotionally appropriate.  Dresses appropriately, pleasant to speak with during exam.  Laughs and smiles during exam.  No suicidal or homicidal ideation. Assessment & Plan1. Anxiety.- Anxiety  exacerbated by current world events and family stressors.- Previously on escitalopram but experienced increased anger after a couple months on the medication.- Continue  current regimen of Xanax as needed for panic attacks.- Fluoxetine  10 mg once daily in the morning has been prescribed to manage his anxiety. Advised to monitor for any side effects such as weight gain or sleep disturbances and to report any concerns. - Encouraged to consider talk therapy and explore options through [facility].- Stable at this time.  No concerns of suicidal or or homicidal ideation.  If you notice any increase in symptoms, please return to the clinic or go straight to the ED.- Advised patient that SSRIs can cause QT prolongation, however, patient is at low risk.  SSRIs can also cause weight gain.  Discussed with patient that if he does start to notice significant weight gain, please follow-up with the office so we can consider starting another medication.- Please follow-up in 4 to 6 weeks to assess mood and for possible dose adjustment to medication.2. Hypertension.- Blood pressure is well-controlled today at 136/80 mmHg.- Continue current hypertensive regimen.- Advised to obtain a new blood pressure monitor for home use to continue monitoring his blood pressure.- If you notice any chest pain, shortness of breath, swelling in the lower extremities, or diaphoresis, please go straight to the ED.3. Medication Management.- Advised to continue his current medication regimen and report any new symptoms or concerns.- Will consider medication review at next visit. Follow-up: A follow-up appointment is scheduled in 4 weeks.This note was dictated wholly or in part with Dragon voice recognition software. A reasonable effort was made at proofreading, but there may be minor transcription errors. Author: Ludie May Katha Roman, Joe Roman  Note signed: 08/06/2024

## 2024-08-07 ENCOUNTER — Other Ambulatory Visit: Payer: Self-pay

## 2024-08-08 ENCOUNTER — Other Ambulatory Visit: Payer: Self-pay

## 2024-08-22 ENCOUNTER — Other Ambulatory Visit: Payer: Self-pay

## 2024-08-29 ENCOUNTER — Ambulatory Visit: Attending: Family Medicine | Admitting: Family Medicine

## 2024-08-29 ENCOUNTER — Other Ambulatory Visit: Payer: Self-pay

## 2024-08-29 VITALS — BP 128/74 | HR 62 | Temp 97.1°F | Ht 66.0 in | Wt 211.2 lb

## 2024-08-29 DIAGNOSIS — N529 Male erectile dysfunction, unspecified: Secondary | ICD-10-CM | POA: Insufficient documentation

## 2024-08-29 DIAGNOSIS — E119 Type 2 diabetes mellitus without complications: Secondary | ICD-10-CM | POA: Insufficient documentation

## 2024-08-29 DIAGNOSIS — I2510 Atherosclerotic heart disease of native coronary artery without angina pectoris: Secondary | ICD-10-CM | POA: Insufficient documentation

## 2024-08-29 DIAGNOSIS — G8929 Other chronic pain: Secondary | ICD-10-CM | POA: Insufficient documentation

## 2024-08-29 DIAGNOSIS — Z860100 Personal history of colon polyps, unspecified: Secondary | ICD-10-CM | POA: Insufficient documentation

## 2024-08-29 DIAGNOSIS — M545 Low back pain, unspecified: Secondary | ICD-10-CM | POA: Insufficient documentation

## 2024-08-29 DIAGNOSIS — E66812 Obesity, class 2: Secondary | ICD-10-CM | POA: Insufficient documentation

## 2024-08-29 DIAGNOSIS — M1990 Unspecified osteoarthritis, unspecified site: Secondary | ICD-10-CM | POA: Insufficient documentation

## 2024-08-29 DIAGNOSIS — F411 Generalized anxiety disorder: Secondary | ICD-10-CM | POA: Insufficient documentation

## 2024-08-29 MED ORDER — GLUCOSE BLOOD VI STRP *A*
1.0000 | ORAL_STRIP | Freq: Every day | 0 refills | Status: AC
Start: 2024-08-29 — End: 2025-08-29

## 2024-08-29 MED ORDER — LANCETS MISC *A*
1.0000 | Freq: Every day | Status: AC
Start: 2024-08-29 — End: 2025-08-29

## 2024-08-29 MED ORDER — BLOOD GLUCOSE MONITORING SUPPL DEVI *A*
Freq: Every day | 0 refills | Status: AC
Start: 2024-08-29 — End: ?

## 2024-08-29 NOTE — Progress Notes (Signed)
 [name] MRN: [id number]  DOB: 1951-02-21 AGE: 73 y.o. SEX: male Chief Complaint: Chief Complaint Patient presents with  Other   Medication reaction  Visit History for  08/29/2024  Data History of Present IllnessHistory of Present IllnessThe patient presents for evaluation of anxiety and diabetes.He reports a general sense of well-being but experienced a particularly challenging day on 07/30/2024 due to financial stress and family issues. His daughter-in-law sent him an email regarding some family matters, which led to a disagreement with his son, causing significant distress. He sought medical help and was prescribed Xanax by his doctor in [place] , which he takes occasionally. The last time he took it was on that day, and it helped him sleep for a few hours. He had an appointment with Danica on 08/06/2024, where they discussed different prescriptions, and Prozac  was suggested as a potential aid. He acknowledges experiencing anxiety and overthinking issues. He began taking Prozac  on 08/14/2024 but discontinued it after 10 or 11 days due to sleep disturbances and racing thoughts. He also discovered that he should not take Advil or Xanax while on Prozac . After stopping Prozac , his sleep improved, and he has been managing well since then.He uses Unisom at night and is making lifestyle changes such as reducing news consumption, increasing sports viewing, exercising, joining [employer], walking daily, meditating, practicing yoga, and watching mindfulness videos. These activities have improved his sleep. He does not believe he is depressed but acknowledges anxiety related to external factors beyond his control. His family life is generally good, and he has resolved recent issues with his daughter-in-law. He has not noticed any mood changes since stopping Prozac . He is happy with his life and has a nice house in [place]  and a new home up here. He has a wonderful wife, daughter, and  son. Things are better with his daughter-in-law, and he has two great grandchildren. He had a very successful career and receives a nice pension, so money is not a problem. He was initially hesitant about taking medication for sleep because he had previously been prescribed trazodone , which caused racing thoughts. He takes Benadryl when he has a head cold, which helps him sleep well, but he does not think it is the best thing to take every night. He takes the lower dose of Unisom. He has done extensive research on anxiety and its triggers. Certain things trigger his anxiety, such as watching a war movie or sitting on a step, and he will take half of Xanax to manage it. However, he tries to avoid these triggers as much as possible. He does not have a stressful life.He is currently on Ozempic  1 mg for diabetes management and reports no issues with its administration. He has lost some weight and plans to continue his daily walks and swimming routine when he returns to [place]  in mid-October 2025. He has been unable to monitor his blood sugar levels at home due to a lack of a glucometer but reports feeling well overall. He has an eye exam scheduled for June 2026.He had back surgery on 04/11/2024 in [place]  for 2 slipped disks. He is back to walking and tries not to lift anything. He will see his doctor in October 2025. He suffered with it for years and actually stopped walking, which he thinks is why his blood sugar went up. He saw a pain specialist and tried injections, but was told the slipped disk would not get better by itself. He was worried about getting back surgery, but the doctor assured  him that he fixes this all the time. It has made a big difference; he can walk without having pain going down his legs.Education Level: Master's degreeOccupation: Works part-time at a [employer]: Watching sports, exercising, joining [employer], walking, meditating, practicing yoga, watching mindfulness  videosSleep: Uses Unisom at night, reports improved sleep with lifestyle changesLiving Condition: Has a house in [place]  and a new home up herePAST SURGICAL HISTORY: - Back surgery on 04/11/2024 for 2 slipped disks Lab Results Component Value Date  LDLC 89 06/04/2024  TSH 2.55 09/14/2023  GLU 126 (H) 06/04/2024  CA 9.9 06/04/2024  NA 138 06/04/2024  K 5.0 06/04/2024  CREAT 1.01 06/04/2024  AST 19 06/04/2024 No results found for: HCT BP Readings from Last 3 Encounters: 08/29/24 128/74 08/06/24 136/69 07/01/24 124/72 Body mass index is 34.09 kg/m.Wt Readings from Last 3 Encounters: 08/29/24 95.8 kg (211 lb 3.2 oz) 08/06/24 94.8 kg (209 lb) 07/01/24 95.7 kg (211 lb)  Review of Systems As hpi  Tobacco  History:  Patient  reports that he has never smoked. He has never been exposed to tobacco smoke. He has never used smokeless tobacco.       Family History[1]Social History[2]Medications and Allergies Medications Ordered Prior to Encounter[3]  Allergies[4]  I have reviewed the above medications and allergies   Objective: Today's vitals:  height is 1.676 m (5' 6) and weight is 95.8 kg (211 lb 3.2 oz). His temperature is 36.2 C (97.1 F). His blood pressure is 128/74 and his pulse is 62. His oxygen saturation is 98%.  Physical ExamVitals and nursing note reviewed. Constitutional:     Appearance: Normal appearance. He is normal weight. HENT:    Head: Normocephalic and atraumatic.    Nose: Nose normal.    Mouth/Throat:    Mouth: Mucous membranes are moist.    Pharynx: Oropharynx is clear. Eyes:    Extraocular Movements: Extraocular movements intact.    Conjunctiva/sclera: Conjunctivae normal.    Pupils: Pupils are equal, round, and reactive to light. Cardiovascular:    Rate and Rhythm: Normal rate and regular rhythm.    Pulses: Normal pulses.    Heart sounds: Normal heart sounds.  Pulmonary:    Effort: Pulmonary effort is normal.    Breath sounds: Normal breath sounds. Musculoskeletal:       General: Normal range of motion.    Cervical back: Normal range of motion and neck supple. Skin:   General: Skin is warm.    Capillary Refill: Capillary refill takes less than 2 seconds. Neurological:    General: No focal deficit present.    Mental Status: He is alert and oriented to person, place, and time. Mental status is at baseline. Psychiatric:       Mood and Affect: Mood normal.       Behavior: Behavior normal.       Thought Content: Thought content normal.       Judgment: Judgment normal.   Assessment/Plan   Decklin was seen today for other.Diagnoses and all orders for this visit:GAD (generalized anxiety disorder)Type 2 diabetes mellitus without complication, without long-term current use of insulin-     lancets; Test using 1 each daily.-     blood glucose monitor device; By no specified route daily. Use to test blood sugar as directedChronic bilateral low back pain without sciaticaOther orders-     blood glucose test strip; Test using 1 strip daily. Assessment & Plan1. Anxiety:- He reported experiencing anxiety, particularly triggered by family stress and overthinking.- He had been  prescribed Xanax by his doctor in [place]  and takes it occasionally. He was also prescribed Prozac  10 mg but discontinued it due to side effects, including trouble sleeping and racing thoughts.- He has been managing his anxiety with non-pharmacological methods such as meditation, exercise, and mindfulness practices.- He will continue using Xanax as needed for severe anxiety episodes. No new medications will be started at this time. If his symptoms change, alternative treatment options will be considered.2. Diabetes mellitus:- He is currently on Ozempic  1 mg and reports feeling well with no issues in administering the  medication. He has lost some weight and feels that his appetite is well-controlled.- A glucometer, test strips, lancets, and a monitor will be ordered through [facility] to help him monitor his blood sugar levels every couple of days. He is advised to wash his hands before testing instead of using alcohol swabs.- He will provide blood sugar readings at his next appointment in October 2025.- He is also advised to obtain a diabetic eye exam and send a copy of the report from his regular eye doctor in [place] .-reviewed lipid panel and cmp and A1c.3. Health maintenance:- He declined the influenza vaccine today but may consider it during his next visit in October 2025.Follow-up: A follow-up visit is scheduled for October 2025.              Follow up  recommended: Follow up for Keep appointment as already scheduled. Patient verbalized understanding of the above instructions. Dr.Raschelle Wisenbaker MD.[facility]  [1] Family HistoryProblem Relation Name Age of Onset  High Blood Pressure Mother    Arthritis Mother    Anxiety disorder Mother    High Blood Pressure Father    Heart Disease Father    Diabetes Father    Cancer Father        color  Heart attack Father    High cholesterol Brother Miquel   Stroke Brother    High cholesterol Brother    High Blood Pressure Maternal Grandmother    Diabetes Maternal Grandmother    Stroke Maternal Grandfather    High Blood Pressure Maternal Grandfather   [2] Social HistoryTobacco Use  Smoking status: Never   Passive exposure: Never  Smokeless tobacco: Never Vaping Use  Vaping status: Never Used Substance Use Topics  Alcohol use: Yes   Comment: 1-2 per week  Drug use: Never [3] Current Outpatient Medications on File Prior to Visit Medication Sig Dispense Refill  semaglutide , 1 mg/dose, (OZEMPIC ) 4 MG/3ML pen Inject 1 mg into the skin once a week. 3 mL 3   alirocumab  (PRALUENT ) 75 mg/mL pen injector Inject 1 pen (75 mg) into the skin every 14 days 6 mL 3  mometasone  (NASONEX ) 50 MCG/ACT nasal spray Spray 2 sprays into each nostril daily for Inflammation of the Sinuses and the Nose. 17 g 1  amLODIPine  (NORVASC ) 10 mg tablet TAKE 1 TABLET (10 MG TOTAL) BY MOUTH DAILY FOR HIGH BLOOD PRESSURE DISORDER. 90 tablet 3  magnesium oxide (MAG-OX) 400 (241.3 mg) mg tablet Take 0.5 tablets (200 mg total) by mouth daily.    metoprolol tartrate (LOPRESSOR) 25 mg tablet Take 0.5 tablets (12.5 mg total) by mouth 2 times daily.    aspirin 81 mg EC tablet Take 1 tablet (81 mg total) by mouth daily.    Multiple Vitamins-Minerals (MULTIVITAMIN ADULTS 50+ PO) Take 1 tablet by mouth daily    cyanocobalamin (VITAMIN B-12) 500 mcg tablet Take 1 tablet (500 mcg total) by mouth daily.    Doxylamine Succinate,  Sleep, (UNISOM PO) Take by mouth.    ALPRAZolam (XANAX) 0.25 mg tablet Take 1 tablet (0.25 mg total) by mouth 2 times daily as needed for Anxiety.   No current facility-administered medications on file prior to visit. [4] No Known Allergies (drug, envir, food or latex)

## 2024-09-11 ENCOUNTER — Ambulatory Visit: Admitting: Family Medicine

## 2024-09-17 ENCOUNTER — Other Ambulatory Visit: Payer: Self-pay

## 2024-09-17 ENCOUNTER — Other Ambulatory Visit: Payer: Self-pay | Admitting: Pharmacist

## 2024-09-18 ENCOUNTER — Encounter: Payer: Self-pay | Admitting: Cardiology

## 2024-09-19 ENCOUNTER — Telehealth: Payer: Self-pay | Admitting: Cardiology

## 2024-09-19 NOTE — Telephone Encounter (Signed)
 Skip for one month and see if the symptoms is better.

## 2024-09-19 NOTE — Telephone Encounter (Signed)
 Spoke to patient advising of recommendations below per RW. Patient verbalized understanding and was agreeable.

## 2024-09-19 NOTE — Telephone Encounter (Signed)
 Joe Roman  P Amb Card Schedulers (supporting Gaither GORMAN Baar, MD)21 hours ago (11:15 AM) JKAppointment Request From: Joe Roman With Provider: Gaither Baar, MD Devin[facility]] Preferred Date Range: Any Preferred Times: Any Reason for visit: Request a New Problem Visit Health Maintenance Topic:  Comments:I Have muscle soreness in my legs and back.  Wondering if its from Praluent 

## 2024-09-23 ENCOUNTER — Encounter: Payer: Self-pay | Admitting: Family Medicine

## 2024-09-24 ENCOUNTER — Other Ambulatory Visit: Payer: Self-pay

## 2024-09-24 ENCOUNTER — Ambulatory Visit: Attending: Family Medicine | Admitting: Family Medicine

## 2024-09-24 VITALS — BP 116/68 | HR 77 | Temp 97.9°F | Ht 66.0 in | Wt 211.1 lb

## 2024-09-24 DIAGNOSIS — I10 Essential (primary) hypertension: Secondary | ICD-10-CM | POA: Insufficient documentation

## 2024-09-24 DIAGNOSIS — E782 Mixed hyperlipidemia: Secondary | ICD-10-CM | POA: Insufficient documentation

## 2024-09-24 DIAGNOSIS — F411 Generalized anxiety disorder: Secondary | ICD-10-CM | POA: Insufficient documentation

## 2024-09-24 DIAGNOSIS — E119 Type 2 diabetes mellitus without complications: Secondary | ICD-10-CM | POA: Insufficient documentation

## 2024-09-24 DIAGNOSIS — E66812 Obesity, class 2: Secondary | ICD-10-CM | POA: Insufficient documentation

## 2024-09-24 MED ORDER — ESCITALOPRAM OXALATE 10 MG PO TABS *I*
10.0000 mg | ORAL_TABLET | Freq: Every day | ORAL | 1 refills | Status: AC
Start: 2024-09-24 — End: ?

## 2024-09-24 MED ORDER — ESCITALOPRAM OXALATE 10 MG PO TABS *I*
10.0000 mg | ORAL_TABLET | Freq: Every day | ORAL | 2 refills | Status: DC
Start: 2024-09-24 — End: 2024-09-24

## 2024-09-24 NOTE — Progress Notes (Signed)
 NAMEEmmerson Roman MRN: [id number]  DOB: [birth year] AGE: 73 y.o. SEX: male Chief Complaint: Chief Complaint Patient presents with  Follow-up   Med check  Visit History for  09/24/2024  Data History of Present IllnessHistory of Present IllnessThe patient is a male who presents for evaluation of anxiety and blood glucose management.He has been monitoring his blood sugar levels at home, with readings of 111 on 09/09/2024 before dinner, 113 on 09/11/2024 at 6:30 AM, 115 on 09/18/2024, and 113 on the morning of 09/24/2024. His previous lab result showed a blood sugar level of 126. He is curious about the frequency of testing. He maintains an active lifestyle, swimming for half an hour without breaks and walking a mile daily. He believes these activities are beneficial for his lung capacity, A1c levels, and overall blood sugar control.He reports that his anxiety is currently manageable. He has been engaging in research on anxiety and has implemented strategies such as reducing TV and news consumption, reading about breathing techniques, practicing meditation via YouTube, and doing yoga. He is considering resuming escitalopram at a low dose, which he found effective in the past. He does not consume alcohol or use drugs. He is planning to travel to [place]  in two weeks.He received the RSV vaccine last year. Last week, he got his influenza vaccine and COVID-19 booster.Social History:Marital Status: MarriedHobbies: Swimming, walking, readingAlcohol: Does not consume alcoholRecreational Drugs: Does not use drugsHe is going to see his back doctor in 2 weeks and will have him send the summary of his back surgery. Lab Results Component Value Date  LDLC 89 06/04/2024  TSH 2.55 09/14/2023  GLU 126 (H) 06/04/2024  CA 9.9 06/04/2024  NA 138 06/04/2024  K 5.0 06/04/2024  CREAT 1.01 06/04/2024  AST 19 06/04/2024 No results found for: HCT BP  Readings from Last 3 Encounters: 09/24/24 116/68 08/29/24 128/74 08/06/24 136/69 Body mass index is 34.07 kg/m.Wt Readings from Last 3 Encounters: 09/24/24 95.8 kg (211 lb 1.6 oz) 08/29/24 95.8 kg (211 lb 3.2 oz) 08/06/24 94.8 kg (209 lb)  Review of Systems Constitutional: Negative.  HENT: Negative.   Eyes: Negative.  Respiratory: Negative.   Cardiovascular: Negative.  Gastrointestinal: Negative.  All other systems reviewed and are negative.   Tobacco  History:  Patient  reports that he has never smoked. He has never been exposed to tobacco smoke. He has never used smokeless tobacco.       Family History[1]Social History[2]Medications and Allergies Medications Ordered Prior to Encounter[3]  Allergies[4]  I have reviewed the above medications and allergies   Objective: Today's vitals:  height is 1.676 m (5' 6) and weight is 95.8 kg (211 lb 1.6 oz). His temperature is 36.6 C (97.9 F). His blood pressure is 116/68 and his pulse is 77. His oxygen saturation is 97%.  Physical ExamConstitutional:     Appearance: Normal appearance. He is obese. Pulmonary:    Effort: Pulmonary effort is normal. Neurological:    Mental Status: He is alert. Mental status is at baseline. Psychiatric:       Mood and Affect: Mood normal.       Behavior: Behavior normal.       Thought Content: Thought content normal.       Judgment: Judgment normal.   Assessment/Plan   Joe Roman was seen today for follow-up.Diagnoses and all orders for this visit:Type 2 diabetes mellitus without complication, without long-term current use of insulin-     Hemoglobin A1c; Future-     Comprehensive metabolic panel; FutureGAD (generalized  anxiety disorder)-     escitalopram (LEXAPRO) 10 mg tablet; Take 1 tablet (10 mg total) by mouth daily.Class 2 obesityHypertension, unspecified typeHyperlipidemia, mixed  Assessment & Plan1. Blood glucose management:- His blood glucose levels have shown a significant decrease, with recent readings of 111, 113, 115, and 113 mg/dL.- His A1c level is currently at 6.2%, which is within the acceptable range.- He is advised to continue monitoring his blood glucose levels once a week.- Repeat blood work, including A1c, liver function, and kidney function tests, will be ordered upon his return from [place] .2. Anxiety:- He reports that his anxiety is currently manageable.- He has been engaging in research on anxiety and has implemented strategies such as reducing TV and news consumption, reading about breathing techniques, practicing meditation via YouTube, and doing yoga.- A prescription for escitalopram 10 mg has been provided, with instructions to take it for a couple of months.- If he responds well to the medication, it will be continued for a while before considering a trial off it. If his condition does not improve, the dosage may be increased.3. Health maintenance:- He received the RSV vaccine last fall.- He got his influenza vaccine and COVID-19 booster last week.- He is advised to contact [facility] and request them to fax a copy of his immunization records to us .Follow-up: A follow-up appointment is scheduled for 11/2024.              Follow up  recommended: Follow up in about 3 months (around 12/12/2024) for Anxiety. Patient verbalized understanding of the above instructions. Dr.Jailynne Opperman MD.[facility]  [1] Family HistoryProblem Relation Name Age of Onset  High Blood Pressure Mother    Arthritis Mother    Anxiety disorder Mother    High Blood Pressure Father    Heart Disease Father    Diabetes Father    Cancer Father        color  Heart attack Father    High cholesterol Brother Joe Roman   Stroke Brother    High cholesterol Brother    High Blood Pressure Maternal Grandmother     Diabetes Maternal Grandmother    Stroke Maternal Grandfather    High Blood Pressure Maternal Grandfather   [2] Social HistoryTobacco Use  Smoking status: Never   Passive exposure: Never  Smokeless tobacco: Never Vaping Use  Vaping status: Never Used Substance Use Topics  Alcohol use: Yes   Comment: 1-2 per week  Drug use: Never [3] Current Outpatient Medications on File Prior to Visit Medication Sig Dispense Refill  blood glucose test strip Test using 1 strip daily. 100 each 0  lancets Test using 1 each daily. 100 each O  blood glucose monitor device By no specified route daily. Use to test blood sugar as directed 1 each 0  semaglutide , 1 mg/dose, (OZEMPIC ) 4 MG/3ML pen Inject 1 mg into the skin once a week. 3 mL 3  alirocumab  (PRALUENT ) 75 mg/mL pen injector Inject 1 pen (75 mg) into the skin every 14 days 6 mL 3  Doxylamine Succinate, Sleep, (UNISOM PO) Take by mouth.    mometasone  (NASONEX ) 50 MCG/ACT nasal spray Spray 2 sprays into each nostril daily for Inflammation of the Sinuses and the Nose. 17 g 1  amLODIPine  (NORVASC ) 10 mg tablet TAKE 1 TABLET (10 MG TOTAL) BY MOUTH DAILY FOR HIGH BLOOD PRESSURE DISORDER. 90 tablet 3  magnesium oxide (MAG-OX) 400 (241.3 mg) mg tablet Take 0.5 tablets (200 mg total) by mouth daily.    ALPRAZolam (XANAX) 0.25 mg tablet  Take 1 tablet (0.25 mg total) by mouth 2 times daily as needed for Anxiety.    metoprolol tartrate (LOPRESSOR) 25 mg tablet Take 0.5 tablets (12.5 mg total) by mouth 2 times daily.    aspirin 81 mg EC tablet Take 1 tablet (81 mg total) by mouth daily.    Multiple Vitamins-Minerals (MULTIVITAMIN ADULTS 50+ PO) Take 1 tablet by mouth daily    cyanocobalamin (VITAMIN B-12) 500 mcg tablet Take 1 tablet (500 mcg total) by mouth daily.   No current facility-administered medications on file prior to visit. [4] No Known Allergies (drug, envir, food or latex)

## 2024-10-03 ENCOUNTER — Ambulatory Visit: Admitting: Family Medicine

## 2024-10-15 ENCOUNTER — Telehealth: Payer: Self-pay | Admitting: Cardiology

## 2024-10-15 ENCOUNTER — Encounter: Payer: Self-pay | Admitting: Cardiology

## 2024-10-15 ENCOUNTER — Other Ambulatory Visit: Payer: Self-pay | Admitting: Pharmacist

## 2024-10-15 ENCOUNTER — Other Ambulatory Visit: Payer: Self-pay | Admitting: Cardiology

## 2024-10-15 DIAGNOSIS — E785 Hyperlipidemia, unspecified: Secondary | ICD-10-CM

## 2024-10-15 NOTE — Telephone Encounter (Signed)
 Please roll the ball.

## 2024-10-15 NOTE — Telephone Encounter (Signed)
 This patient has been identified as a candidate for Leqvio (inclisiran) treatment, [id number]. Please review below for prior authorization request. Medication name, strength, instructions: Leqvio 284 mg as initial injection, again at 3 months, and then every 6 months thereafterDiagnosis: High CV risk, hyperlipidemiaDiagnosis code: e78.5Previous medications tried and outcome: statin/ezetimibe/Repatha/Praluent  intoleranceLipid panel dated 06/04/24, 5/3/24Office note dated 7/7/25Telephone encounters dated 09/19/24, 10/21/25Please also include a no print order for the medication

## 2024-10-15 NOTE — Telephone Encounter (Signed)
 Medication QuestionName of medication:alirocumabDose: 75mg What question or concern: pt would like to know if he should restart medication or if there is another medication that is recommended

## 2024-10-15 NOTE — Telephone Encounter (Signed)
 Spoke with pt who reports feeling better since stopping Praluent  last month. He is wondering if he should try it again or if there is something else he could try? I told him we would ask and get back to him. Patient verbalized understanding and is agreeable to this plan.

## 2024-10-16 MED ORDER — INCLISIRAN SODIUM 284 MG/1.5ML SC SOSY *I*
PREFILLED_SYRINGE | SUBCUTANEOUS | Status: AC
Start: 2024-10-16 — End: ?

## 2024-10-18 ENCOUNTER — Other Ambulatory Visit: Payer: Self-pay | Admitting: Family Medicine

## 2024-10-18 DIAGNOSIS — I10 Essential (primary) hypertension: Secondary | ICD-10-CM

## 2024-10-18 NOTE — Telephone Encounter (Signed)
 Last office visit: Last Office Visit   Date Provider Department Visit Type Primary Dx  09/24/2024 Stephens Bolt, MD [facility] Office Visit Type 2 diabetes mellitus without complication, without long-term current use of insulin   Last PA Office Visit   Date Provider Department Visit Type Primary Dx  08/06/2024 Denney Dukes May, PA [facility] Office Visit GAD (generalized anxiety disorder)   Patients upcoming appointments:Future Appointments Date Time Provider Department Center 12/10/2024  8:40 AM Stephens Bolt, MD [facility] None 12/16/2024 11:40 AM Josephina Gaither RAMAN, MD [facility] Recent Lab results:GENERAL CHEMISTRY Recent Labs   06/10/250831 NA 138 K 5.0 CL 103 CO2 22 GAP 13 UN 13 CREAT 1.01 GLU 126* CA 9.9  LIPID PROFILE Recent Labs   06/10/250831 CHOL 157 TRIG 112 HDL 48 LDLC 89  LIVER PROFILE Recent Labs   06/10/250831 ALT 17 AST 19 ALK 115 TB 0.3  DIABETES THYROID  Recent Labs   06/10/250831 HA1C 6.2*  No value within the past 365 days  Pending/Orders Labs:Lab Frequency Next Occurrence Hemoglobin A1c Once 09/24/2024 Comprehensive metabolic panel Once 09/24/2024  Opioid Drug Screen:No results for input(s): AMPU, BEU, OPSU, OXYU, THCU, BZDU, COPS, CTHC, UCRNC, TRAMD in the last 8760 hours.Last dispensed if controlled:

## 2024-11-04 ENCOUNTER — Ambulatory Visit: Admitting: Cardiology

## 2024-11-13 ENCOUNTER — Other Ambulatory Visit: Payer: Self-pay

## 2024-11-13 NOTE — Telephone Encounter (Signed)
 11/19 spoke to patient/scheduled inclisiran injection.He is asking if he stops taking the Praluent  altogether?

## 2024-12-09 ENCOUNTER — Other Ambulatory Visit
Admission: RE | Admit: 2024-12-09 | Discharge: 2024-12-09 | Disposition: A | Discharge status: Home / Self Care | Source: Ambulatory Visit | Attending: Family Medicine | Admitting: Family Medicine

## 2024-12-09 ENCOUNTER — Other Ambulatory Visit: Payer: Self-pay

## 2024-12-09 DIAGNOSIS — E119 Type 2 diabetes mellitus without complications: Secondary | ICD-10-CM | POA: Insufficient documentation

## 2024-12-09 LAB — COMPREHENSIVE METABOLIC PANEL
ALT: 17 U/L (ref 0–50)
AST: 24 U/L (ref 0–50)
Albumin: 4 g/dL (ref 3.5–5.2)
Alk Phos: 75 U/L (ref 40–130)
Anion Gap: 11 (ref 7–16)
Bilirubin,Total: 0.5 mg/dL (ref 0.0–1.2)
CO2: 20 mmol/L (ref 20–28)
Calcium: 9.8 mg/dL (ref 8.6–10.2)
Chloride: 103 mmol/L (ref 96–108)
Creatinine: 0.99 mg/dL (ref 0.67–1.17)
Glucose: 107 mg/dL — ABNORMAL HIGH (ref 60–99)
Lab: 14 mg/dL (ref 6–20)
Potassium: 4.6 mmol/L (ref 3.3–5.1)
Sodium: 134 mmol/L (ref 133–145)
Total Protein: 6.5 g/dL (ref 6.3–7.7)
eGFR BY CREAT: 79

## 2024-12-09 LAB — HEMOGLOBIN A1C: Hemoglobin A1C: 5.5 % (ref <=5.6)

## 2024-12-10 ENCOUNTER — Ambulatory Visit: Admitting: Family Medicine

## 2024-12-13 ENCOUNTER — Ambulatory Visit: Attending: Cardiology

## 2024-12-13 ENCOUNTER — Other Ambulatory Visit: Payer: Self-pay

## 2024-12-13 DIAGNOSIS — E785 Hyperlipidemia, unspecified: Secondary | ICD-10-CM | POA: Insufficient documentation

## 2024-12-13 MED ORDER — INCLISIRAN SODIUM 284 MG/1.5ML SC SOSY *I*
284.0000 mg | PREFILLED_SYRINGE | Freq: Once | SUBCUTANEOUS | Status: AC
  Administered 2024-12-13: 284 mg via SUBCUTANEOUS

## 2024-12-13 NOTE — Patient Instructions (Signed)
 Possible side effects from injection and what to call the office about: Pain/redness/swelling at injection site, signs of common cold or bronchitis, joint pain or swelling, persistent diarrhea not induced by poor diet, muscle aches in arms/legs. If any of the listed symptoms persist 48 hours after your injection, call us at [phone].    Your injection site: Abdomen    Your next injection will be in 3 months

## 2024-12-13 NOTE — Progress Notes (Signed)
 Pt in for inclisiran injection appt, reviewed possible side effects from injection and what to call the office at [phone] about (pain/redness/swelling at injection site, signs of common cold or bronchitis, arthralgia/joint swelling, persistent diarrhea not induced by poor diet, myalgias in arms/legs).Injection site: AbdomenNext injection window: 3/19/2026Pt placed on reminder listPrior auth expiration date: PA not requiredAnswered all questions asked by patient and stable for discharge homeJacob A Semaj Kham, RN

## 2024-12-15 ENCOUNTER — Ambulatory Visit: Payer: Self-pay | Admitting: Family Medicine

## 2024-12-15 ENCOUNTER — Other Ambulatory Visit: Payer: Self-pay

## 2024-12-16 ENCOUNTER — Encounter: Payer: Self-pay | Admitting: Cardiology

## 2024-12-16 ENCOUNTER — Ambulatory Visit: Attending: Cardiology | Admitting: Cardiology

## 2024-12-16 VITALS — BP 138/61 | HR 63 | Ht 66.0 in | Wt 205.4 lb

## 2024-12-16 DIAGNOSIS — E785 Hyperlipidemia, unspecified: Secondary | ICD-10-CM | POA: Insufficient documentation

## 2024-12-16 NOTE — Progress Notes (Signed)
 [facility]  Cardiology Office Revisit NoteDate of Visit: 12/16/2024 Patient: Joe Roman Patients PCP: Thakor, Saheen, MD Patient DOB: 1952-09-11EMRN: [id number] Subjective/Reason For Visit I had the pleasure of seeing Joe Roman in cardiology followup on 12/16/2024. Stable from cardiac standpoint since seen.Has received first dose inclirisan and tolerated ok.Heading to [place]  for the winter and will arrange 3 month inclirisan injection with cardiologist in [place].Appears comfortable.Is active.Past Medical History: Diagnosis Date  Anxiety   Arthritis   GERD (gastroesophageal reflux disease)   Hyperlipemia   Hypertension  Past Surgical History: Procedure Laterality Date  KNEE REPLACEMENT Bilateral   left hip replacement Left   SPINAL FUSION   ROSMedications Current Outpatient Medications Medication Sig  amLODIPine  (NORVASC ) 10 mg tablet TAKE 1 TABLET (10 MG TOTAL) BY MOUTH DAILY FOR HIGH BLOOD PRESSURE DISORDER.  inclisiran sodium  (LEQVIO ) 284 MG/1.5ML prefilled syringe Inject 1.5 mLs (284 mg total) into the skin for initial dose, again at 3 months, and then every 6 months thereafter  escitalopram  (LEXAPRO ) 10 mg tablet Take 1 tablet (10 mg total) by mouth daily.  blood glucose test strip Test using 1 strip daily.  lancets Test using 1 each daily.  blood glucose monitor device By no specified route daily. Use to test blood sugar as directed  semaglutide , 1 mg/dose, (OZEMPIC ) 4 MG/3ML pen Inject 1 mg into the skin once a week.  magnesium oxide (MAG-OX) 400 (241.3 mg) mg tablet Take 0.5 tablets (200 mg total) by mouth daily.  metoprolol tartrate (LOPRESSOR) 25 mg tablet Take 0.5 tablets (12.5 mg total) by mouth 2 times daily.  aspirin 81 mg EC tablet Take 1 tablet (81 mg total) by mouth daily.  Multiple Vitamins-Minerals (MULTIVITAMIN ADULTS 50+ PO) Take 1 tablet by mouth daily  cyanocobalamin  (VITAMIN B-12) 500 mcg tablet Take 1 tablet (500 mcg total) by mouth daily.  Doxylamine Succinate, Sleep, (UNISOM PO) Take by mouth.  mometasone  (NASONEX ) 50 MCG/ACT nasal spray Spray 2 sprays into each nostril daily for Inflammation of the Sinuses and the Nose.  ALPRAZolam (XANAX) 0.25 mg tablet Take 1 tablet (0.25 mg total) by mouth 2 times daily as needed for Anxiety. Vitals and Physical Exam Joe Roman's  height is 1.676 m (5' 6) and weight is 93.2 kg (205 lb 6.4 oz). His blood pressure is 138/61 and his pulse is 63. His oxygen saturation is 97%.  Body mass index is 33.15 kg/m.Physical ExamConstitutional:     General: He is not in acute distress.   Appearance: He is well-developed. HENT:    Head: Normocephalic and atraumatic. Neck:    Vascular: No JVD. Cardiovascular:    Rate and Rhythm: Normal rate and regular rhythm.    Heart sounds: Normal heart sounds. No murmur heard.   No friction rub. No gallop. Pulmonary:    Effort: Pulmonary effort is normal. No respiratory distress.    Breath sounds: Normal breath sounds. No wheezing or rales. Skin:   General: Skin is warm. Neurological:    Mental Status: He is alert. Laboratory Data Hematology: No results found for requested labs within last 730 days. Chemistry: Results in Past 730 DaysResult Component Current Result Previous Result Sodium 134 (12/09/2024) 138 (06/04/2024) Potassium 4.6 (12/09/2024) 5.0 (06/04/2024) Creatinine 0.99 (12/09/2024) 1.01 (06/04/2024) Glucose 107 (H) (12/09/2024) 126 (H) (06/04/2024) Calcium 9.8 (12/09/2024) 9.9 (06/04/2024) Hemoglobin A1C 5.5 (12/09/2024) 6.2 (H) (06/04/2024) AST 24 (12/09/2024) 19 (06/04/2024) ALT 17 (12/09/2024) 17 (06/04/2024) TSH 2.55 (09/14/2023) Not in Time Range Coagulation Studies: No results found for requested labs within last  730 days. Cardiac: No results found for requested labs within last 730 days. 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 male with atherosclerosis is doing ok; continue as planned; see back in followup in 6 months. Plan explained and accepted.  GAITHER GORMAN BAAR, MDElectronically signed on 12/16/2024 at 11:43 AM.

## 2024-12-17 ENCOUNTER — Ambulatory Visit: Admitting: Family Medicine

## 2024-12-17 ENCOUNTER — Other Ambulatory Visit: Payer: Self-pay

## 2024-12-18 ENCOUNTER — Ambulatory Visit: Attending: Family Medicine | Admitting: Family Medicine

## 2024-12-18 ENCOUNTER — Other Ambulatory Visit: Payer: Self-pay

## 2024-12-18 VITALS — BP 122/72 | HR 65 | Temp 97.9°F | Wt 209.6 lb

## 2024-12-18 DIAGNOSIS — F411 Generalized anxiety disorder: Secondary | ICD-10-CM | POA: Insufficient documentation

## 2024-12-18 DIAGNOSIS — G8929 Other chronic pain: Secondary | ICD-10-CM | POA: Insufficient documentation

## 2024-12-18 DIAGNOSIS — I10 Essential (primary) hypertension: Secondary | ICD-10-CM | POA: Insufficient documentation

## 2024-12-18 DIAGNOSIS — E119 Type 2 diabetes mellitus without complications: Secondary | ICD-10-CM | POA: Insufficient documentation

## 2024-12-18 DIAGNOSIS — E782 Mixed hyperlipidemia: Secondary | ICD-10-CM | POA: Insufficient documentation

## 2024-12-18 DIAGNOSIS — Z0000 Encounter for general adult medical examination without abnormal findings: Secondary | ICD-10-CM | POA: Insufficient documentation

## 2024-12-18 DIAGNOSIS — M545 Low back pain, unspecified: Secondary | ICD-10-CM | POA: Insufficient documentation

## 2024-12-18 DIAGNOSIS — E66812 Obesity, class 2: Secondary | ICD-10-CM | POA: Insufficient documentation

## 2024-12-18 NOTE — Patient Instructions (Signed)
 Thank you for completing your Subsequent Annual Medicare Visit and Follow-up (Go over blood work / started new med: Leqvio  ) with us  today. The purpose of this visits was un:Drmzzw for diseaseAssess risk of future medical problemsHelp develop a healthy lifestyleUpdate vaccinesGet to know your doctor in case of an illnessPatient Care Team:Zanylah Hardie, Annye, MD as PCP - General (Family Medicine)Wiener, Gaither RAMAN, MD as Consulting Provider (Cardiology)Ditomaso, Curtistine, MD (Gastroenterology)McLeod, Suzen, OD (Optometry) Medicare 5 Year PlanThe following items were identified as areas of concern during your screening today:Diabetes - This is a risk factor for Heart Attack, Stroke, Kidney Problems, Eye Problems and a loss of feeling in your fingers and feet. The Health Maintenance table below identifies screening tests and immunizations recommended by your health care team:Health Maintenance: These screening recommendations are based on [place], pulte homes, and [place] state guidelines Topic Date Due  RSV Vaccine (1 - Risk 50-74 years 1-dose series) Never done  COVID-19 Vaccine (7 - 2025-26 season) 03/19/2025  Diabetic Kidney Disease Urine  06/04/2025  Diabetic A1C Monitoring  06/09/2025  Diabetic Foot Exam  06/12/2025  Fall Risk  09/24/2025  Depression - Yearly  10/18/2025  Diabetic Kidney Disease Blood  12/09/2025  Diabetic Eye Exam  06/10/2026  Colon Cancer Screening - Other  02/05/2029  DTaP/Tdap/Td Vaccines (4 - Td or Tdap) 08/20/2031  HPV Vaccine (No Doses Required) Completed  Flu Shot  Completed  HIV Screening  Completed  Hepatitis C Screening  Completed  Pneumococcal Vaccination  Completed  Hepatitis B Vaccine  Aged Out  HIB Vaccine  Aged Out  Meningococcal Vaccine  Aged Out  Rotavirus Vaccine  Aged Out  Meningitis Vaccine  Aged Out  Shingles Vaccine  Discontinued In addition, goals and orders  placed to address these recommendations are listed in the Today's Visit section.We wish you the best of health and look forward to seeing you again next year for your Annual Medicare Wellness Visit. If you have any health care concerns before then, please do not hesitate to contact us .

## 2024-12-18 NOTE — Progress Notes (Addendum)
 NAMEWilhelm Roman MRN: [id number]  DOB: 05-30-1951 AGE: 73 y.o. SEX: male Chief Complaint: Chief Complaint Patient presents with  Subsequent Annual Medicare Visit  Follow-up   Go over blood work / started new med: Leqvio    Visit History for  12/18/2024  Data History of Present IllnessThe patient presents for evaluation of anxiety, diabetes mellitus, and hyperlipidemia.He reports a positive response to escitalopram , with improved sleep quality and a reduction in overthinking. He has been engaging in regular physical activity, including walking 1.5 to 2 miles daily and swimming for 30 minutes without rest. He also mentions an improvement in his relationship with his daughter-in-law, which has alleviated some of his stress.His A1c levels have decreased from 6.2 to 5.5, which he attributes to his exercise regimen. He has experienced weight loss, dropping from 226 pounds to 206-207 pounds, and reports no side effects from Ozempic . He recalls a period of prediabetes and expresses satisfaction with his current health status. Approximately 2 weeks ago, he recorded high blood sugar readings on his monitor, ranging from 238 to 258, prompting him to seek medical attention at an emergency room in [place] where his blood sugar was found to be 103. He suspects a malfunction with his monitor and plans to purchase a new one.He has a history of muscle soreness and received an injection from Dr. Jacklin last Friday, with no adverse reactions. A follow-up appointment on Monday revealed good results. He has a scheduled appointment with his cardiologist in [place]  in 01/2025 and plans to arrange another appointment in 02/2025. He has a history of taking Lipitor and 3 other medications, which were ineffective, and Repatha, which significantly reduced his numbers but caused nocturnal cramps.He had fusion of 2 joints and he saw his doctor who confirmed he is completely healed. She gave him  permission to exercise, not lift heavy weights, and do yoga.Hobbies: Walking, swimming, yogaSleep: Reports improved sleep qualityPAST SURGICAL HISTORY:Fusion of 2 joints Lab Results Component Value Date  LDLC 89 06/04/2024  TSH 2.55 09/14/2023  GLU 107 (H) 12/09/2024  CA 9.8 12/09/2024  NA 134 12/09/2024  K 4.6 12/09/2024  CREAT 0.99 12/09/2024  AST 24 12/09/2024 No results found for: HCT BP Readings from Last 3 Encounters: 12/18/24 122/72 12/16/24 138/61 09/24/24 116/68 Body mass index is 33.83 kg/m.Wt Readings from Last 3 Encounters: 12/18/24 95.1 kg (209 lb 9.6 oz) 12/16/24 93.2 kg (205 lb 6.4 oz) 09/24/24 95.8 kg (211 lb 1.6 oz)  Review of Systems Constitutional: Negative.  HENT: Negative.   Eyes: Negative.  Respiratory: Negative.   Cardiovascular: Negative.  Gastrointestinal: Negative.  All other systems reviewed and are negative. As hpi  Tobacco  History:  Patient  reports that he has never smoked. He has never been exposed to tobacco smoke. He has never used smokeless tobacco.       Family History[1]Social History[2]Medications and Allergies Medications Ordered Prior to Encounter[3]  Allergies[4]  I have reviewed the above medications and allergies   Objective: Today's vitals:  weight is 95.1 kg (209 lb 9.6 oz). His temperature is 36.6 C (97.9 F). His blood pressure is 122/72 and his pulse is 65. His oxygen saturation is 98%.  Physical ExamConstitutional:     Appearance: Normal appearance. Pulmonary:    Effort: Pulmonary effort is normal. Neurological:    Mental Status: He is alert. Mental status is at baseline. Psychiatric:       Mood and Affect: Mood normal.       Behavior: Behavior normal.  Thought Content: Thought content normal.       Judgment: Judgment normal.   Assessment/Plan   Joe Roman was seen today for subsequent annual medicare visit  and follow-up.Diagnoses and all orders for this visit:Type 2 diabetes mellitus without complication, without long-term current use of insulin-     Hemoglobin A1c; Future-     Comprehensive metabolic panel; Future-     Microalbumin, Urine, Random; Future-     Vitamin D; FutureClass 2 obesityHyperlipidemia, mixed-     Lipid Panel (Reflex to Direct  LDL if Triglycerides more than 400); FutureHypertension, unspecified typePreventative health careGAD (generalized anxiety disorder)Chronic bilateral low back pain without sciatica Assessment & Plan1. Anxiety:- Significant improvement in symptoms with the use of escitalopram . Sleeping better and not overthinking as much.- Will continue on escitalopram  2. Diabetes mellitus:- A1c level has decreased to 5.5, indicating good control of diabetes. Weight has decreased from 226 lbs to 206-207 lbs.- Will continue on Ozempic  as it is well-tolerated and effective. Does not need to monitor blood sugar frequently due to stable levels.3. Hyperlipidemia:- Will have lipid levels checked within the first 3 months of starting the new medication. Has an appointment with the cardiologist in 01/2025 and will set up another appointment in 02/2025.- Currently on a new medication prescribed by Dr. Jacklin, which does not require frequent liver function monitoring.Follow-up: 05/2025.              Follow up  recommended: Follow up in about 6 months (around 06/18/2025) for Reassessment. Patient verbalized understanding of the above instructions. Dr.Thaxton Pelley MD.[facility] Visit performed as:        Office Visit, met with patient in personToday we reviewed and updated Joe Roman smoking status, activities of daily living, depression screen, fall risk, medications and allergies. I have counseled the patient in the above areas. Subjective: Chief Complaint: Joe Roman is a 73 y.o. male here for a/an Subsequent Annual Medicare Visit and Follow-up (Go over blood work / started new med: Leqvio  )In general, Joe Roman rates their overall health jd:[id number] Care Team:Emylia Latella, Theatre Stage Manager, MD as PCP - General (Family Medicine)Wiener, Gaither RAMAN, MD as Consulting Provider (Cardiology)Ditomaso, Curtistine, MD (Gastroenterology)McLeod, Suzen, OD (Optometry) Medications Ordered Prior to Encounter[5]Allergies[6]Patient Active Problem List  Diagnosis Date Noted  Arteriosclerosis of coronary artery 08/29/2024  Class 2 obesity 08/29/2024  Erectile dysfunction 08/29/2024  History of colonic polyps 08/29/2024  Osteoarthrosis 08/29/2024  Type 2 diabetes mellitus without complications 10/11/2023  Hyperlipidemia, mixed 10/11/2023  GAD (generalized anxiety disorder) 10/11/2023  Insomnia due to other mental disorder 10/11/2023  Chronic bilateral low back pain without sciatica 04/27/2023   S/p surgery, slipped disk in 03/2024 in [place]   Hypertension, unspecified type 04/27/2023  Anxiety with flying 04/27/2023 Past Medical History[7]Past Surgical History[8]Family History[9]Social History[10]Objective: Vital Signs: BP 122/72   Pulse 65   Temp 36.6 C (97.9 F)   Wt 95.1 kg (209 lb 9.6 oz)   SpO2 98%   BMI 33.83 kg/m  BMI: Body mass index is 33.83 kg/m.Vision Screening Results (Welcome visit only):No results found.Depression Screening Results:Review Flowsheet    08/06/2024 12/12/2023 04/27/2023 03/16/2023 PHQ Scores PHQ Q9 - Better Off Dead 0 - - - PHQ Calculated Score 3 0 0 0 No questionnaires on file. Opioid Use/DAST- 10 Screening Results: No data recordedActivities of Daily Living/Functional Screening Results:Is the person deaf or does he/she have serious difficulty hearing?: N (12/18/2024  7:54 AM)Is this person blind or does he/she have serious difficulty seeing even  when wearing glasses?: N (12/18/2024  7:54 AM)*Vision Status: Visual aid  (12/18/2024  7:54 AM)Does this person have serious difficulty walking or climbing stairs?: N (12/18/2024  7:54 AM)Does this person have difficulty dressing or bathing?: N (12/18/2024  7:54 AM)*Shopping: Independent (12/18/2024  7:54 AM)*House Keeping: Independent (12/18/2024  7:54 AM)*Managing Own Medications: Independent (12/18/2024  7:54 AM)*Handling Finances: Independent (12/18/2024  7:54 AM)Difficulty doing errands due to a physical, mental or emotional condition: No (12/18/2024  7:54 AM)Difficulty remembering or making decisions due to a physical, mental or emotional condition: No (12/18/2024  7:54 AM)Fall Risk Screening Results:No data recordedAssessment and Plan: Cognitive Function:Recall of recent and remote events appears:Normal  Advanced Care Planning:was discussed and the paperwork can be found in the scanned media section The following health maintenance plan was reviewed with the patient:Health Maintenance Topics with due status: Overdue   Topic Date Due  IMM-RSV (Pregnant, Increased Risk 50-74, and 75+) Never done Health Maintenance Topics with due status: Not Due   Topic Last Completion Date  IMM DTaP/Tdap/Td 08/19/2021  Colon Cancer Screening Other 02/06/2024  Diabetic Nephropathy Screening - Urine 06/04/2024  Diabetic Eye Exam ADA 06/10/2024  Diabetic Foot Exam ADA 06/12/2024  COVID-19 Vaccine 09/19/2024  Fall Risk Screening 09/24/2024  Depression Screen Yearly 10/18/2024  Diabetic A1C Monitoring ADA 12/09/2024  Diabetic Nephropathy Screening - Blood 12/09/2024 Health Maintenance Topics with due status: Completed   Topic Last Completion Date  IMM Pneumo: 50+ Years 10/05/2021  HIV Screening [place] 06/04/2024  Hepatitis C Screening [place] 06/04/2024  IMM-Influenza 09/19/2024  IMM-HPV 9-26 Yrs or Shared Decision  (27-45 Yrs) Completed Health Maintenance Topics with due status: Aged Out   Topic Date Due  IMM-Hepatitis B Vaccine Aged Out  IMM-HIB 0-5 Yrs or At-Risk Patients Aged Out  IMM-MCV4 0-18 Yrs or At-Risk Patients Aged Out  IMM-Rotavirus 0-8 Months Aged Out  IMM-MenB (2 Plans: Shared decision & Increased Risk Plans) Aged Out Health Maintenance Topics with due status: Discontinued   Topic Date Due  IMM-Zoster Discontinued This health maintenance schedule, identified risks, a list of orders placed today and patient goals have been provided to [employer] in the after visit summary. Plan for any concerns identified during screening or risk assessments:Up to date [1] Family HistoryProblem Relation Name Age of Onset  High Blood Pressure Mother    Arthritis Mother    Anxiety disorder Mother    High Blood Pressure Father    Heart Disease Father    Diabetes Father    Cancer Father        color  Heart attack Father    High cholesterol Brother Miquel   Stroke Brother    High cholesterol Brother    High Blood Pressure Maternal Grandmother    Diabetes Maternal Grandmother    Stroke Maternal Grandfather    High Blood Pressure Maternal Grandfather   [2] Social HistoryTobacco Use  Smoking status: Never   Passive exposure: Never  Smokeless tobacco: Never Vaping Use  Vaping status: Never Used Substance Use Topics  Alcohol use: Yes   Comment: 1-2 per week  Drug use: Never [3] Current Outpatient Medications on File Prior to Visit Medication Sig Dispense Refill  amLODIPine  (NORVASC ) 10 mg tablet TAKE 1 TABLET (10 MG TOTAL) BY MOUTH DAILY FOR HIGH BLOOD PRESSURE DISORDER. 90 tablet 3  inclisiran sodium  (LEQVIO ) 284 MG/1.5ML prefilled syringe Inject 1.5 mLs (284 mg total) into the skin for initial dose, again at 3 months, and then every 6 months thereafter    escitalopram  (LEXAPRO ) 10 mg tablet Take 1 tablet (  10 mg total)  by mouth daily. 90 tablet 1  blood glucose test strip Test using 1 strip daily. 100 each 0  lancets Test using 1 each daily. 100 each O  blood glucose monitor device By no specified route daily. Use to test blood sugar as directed 1 each 0  semaglutide , 1 mg/dose, (OZEMPIC ) 4 MG/3ML pen Inject 1 mg into the skin once a week. 3 mL 3  Doxylamine Succinate, Sleep, (UNISOM PO) Take by mouth.    mometasone  (NASONEX ) 50 MCG/ACT nasal spray Spray 2 sprays into each nostril daily for Inflammation of the Sinuses and the Nose. 17 g 1  magnesium oxide (MAG-OX) 400 (241.3 mg) mg tablet Take 0.5 tablets (200 mg total) by mouth daily.    ALPRAZolam (XANAX) 0.25 mg tablet Take 1 tablet (0.25 mg total) by mouth 2 times daily as needed for Anxiety.    metoprolol tartrate (LOPRESSOR) 25 mg tablet Take 0.5 tablets (12.5 mg total) by mouth 2 times daily.    aspirin 81 mg EC tablet Take 1 tablet (81 mg total) by mouth daily.    Multiple Vitamins-Minerals (MULTIVITAMIN ADULTS 50+ PO) Take 1 tablet by mouth daily    cyanocobalamin (VITAMIN B-12) 500 mcg tablet Take 1 tablet (500 mcg total) by mouth daily.   No current facility-administered medications on file prior to visit. [4] No Known Allergies (drug, envir, food or latex)[5] Current Outpatient Medications on File Prior to Visit Medication Sig Dispense Refill  amLODIPine  (NORVASC ) 10 mg tablet TAKE 1 TABLET (10 MG TOTAL) BY MOUTH DAILY FOR HIGH BLOOD PRESSURE DISORDER. 90 tablet 3  inclisiran sodium  (LEQVIO ) 284 MG/1.5ML prefilled syringe Inject 1.5 mLs (284 mg total) into the skin for initial dose, again at 3 months, and then every 6 months thereafter    escitalopram  (LEXAPRO ) 10 mg tablet Take 1 tablet (10 mg total) by mouth daily. 90 tablet 1  blood glucose test strip Test using 1 strip daily. 100 each 0  lancets Test using 1 each daily. 100 each O  blood glucose monitor device By no specified route daily. Use to test blood sugar as  directed 1 each 0  semaglutide , 1 mg/dose, (OZEMPIC ) 4 MG/3ML pen Inject 1 mg into the skin once a week. 3 mL 3  Doxylamine Succinate, Sleep, (UNISOM PO) Take by mouth.    mometasone  (NASONEX ) 50 MCG/ACT nasal spray Spray 2 sprays into each nostril daily for Inflammation of the Sinuses and the Nose. 17 g 1  magnesium oxide (MAG-OX) 400 (241.3 mg) mg tablet Take 0.5 tablets (200 mg total) by mouth daily.    ALPRAZolam (XANAX) 0.25 mg tablet Take 1 tablet (0.25 mg total) by mouth 2 times daily as needed for Anxiety.    metoprolol tartrate (LOPRESSOR) 25 mg tablet Take 0.5 tablets (12.5 mg total) by mouth 2 times daily.    aspirin 81 mg EC tablet Take 1 tablet (81 mg total) by mouth daily.    Multiple Vitamins-Minerals (MULTIVITAMIN ADULTS 50+ PO) Take 1 tablet by mouth daily    cyanocobalamin (VITAMIN B-12) 500 mcg tablet Take 1 tablet (500 mcg total) by mouth daily.   No current facility-administered medications on file prior to visit. [6] No Known Allergies (drug, envir, food or latex)[7] Past Medical History:Diagnosis Date  Anxiety   Arthritis   GERD (gastroesophageal reflux disease)   Hyperlipemia   Hypertension  [8] Past Surgical History:Procedure Laterality Date  KNEE REPLACEMENT Bilateral   left hip replacement Left   SPINAL FUSION   [  9] Family HistoryProblem Relation Name Age of Onset  High Blood Pressure Mother    Arthritis Mother    Anxiety disorder Mother    High Blood Pressure Father    Heart Disease Father    Diabetes Father    Cancer Father        color  Heart attack Father    High cholesterol Brother Miquel   Stroke Brother    High cholesterol Brother    High Blood Pressure Maternal Grandmother    Diabetes Maternal Grandmother    Stroke Maternal Grandfather    High Blood Pressure Maternal Grandfather   [10] Social HistorySocioeconomic History  Marital status: Married Tobacco Use  Smoking status: Never    Passive exposure: Never  Smokeless tobacco: Never Vaping Use  Vaping status: Never Used Substance and Sexual Activity  Alcohol use: Yes   Comment: 1-2 per week  Drug use: Never  Sexual activity: Yes   Partners: Female   Birth control/protection: None

## 2025-03-14 ENCOUNTER — Ambulatory Visit

## 2025-06-18 ENCOUNTER — Ambulatory Visit: Admitting: Family Medicine

## 2025-12-23 ENCOUNTER — Ambulatory Visit: Admitting: Family Medicine
